# Patient Record
Sex: Female | Born: 1970 | Race: White | Hispanic: No | Marital: Married | Smoking: Former smoker
Health system: Southern US, Community
[De-identification: ages and names within clinical notes are randomized; demographics above are authoritative.]

## PROBLEM LIST (undated history)

## (undated) DIAGNOSIS — Z9889 Other specified postprocedural states: Secondary | ICD-10-CM

## (undated) DIAGNOSIS — N951 Menopausal and female climacteric states: Secondary | ICD-10-CM

## (undated) DIAGNOSIS — G4700 Insomnia, unspecified: Secondary | ICD-10-CM

## (undated) DIAGNOSIS — J45909 Unspecified asthma, uncomplicated: Secondary | ICD-10-CM

## (undated) DIAGNOSIS — R112 Nausea with vomiting, unspecified: Secondary | ICD-10-CM

## (undated) DIAGNOSIS — T8859XA Other complications of anesthesia, initial encounter: Secondary | ICD-10-CM

## (undated) HISTORY — DX: Insomnia, unspecified: G47.00

## (undated) HISTORY — PX: BREAST LUMPECTOMY: SHX2

## (undated) HISTORY — DX: Menopausal and female climacteric states: N95.1

## (undated) HISTORY — PX: TUBAL LIGATION: SHX77

---

## 2006-07-10 ENCOUNTER — Ambulatory Visit: Payer: Self-pay

## 2009-11-23 ENCOUNTER — Encounter: Payer: Self-pay | Admitting: Obstetrics & Gynecology

## 2010-04-11 ENCOUNTER — Observation Stay: Payer: Self-pay | Admitting: Obstetrics and Gynecology

## 2010-04-13 ENCOUNTER — Observation Stay: Payer: Self-pay

## 2010-04-25 ENCOUNTER — Other Ambulatory Visit: Payer: Self-pay

## 2010-05-03 ENCOUNTER — Inpatient Hospital Stay: Payer: Self-pay | Admitting: Obstetrics and Gynecology

## 2010-05-07 LAB — PATHOLOGY REPORT

## 2011-01-17 ENCOUNTER — Ambulatory Visit: Payer: Self-pay | Admitting: Obstetrics and Gynecology

## 2012-02-20 ENCOUNTER — Ambulatory Visit: Payer: Self-pay | Admitting: Obstetrics and Gynecology

## 2013-02-08 ENCOUNTER — Emergency Department: Payer: Self-pay | Admitting: Emergency Medicine

## 2013-02-08 LAB — COMPREHENSIVE METABOLIC PANEL
Alkaline Phosphatase: 96 U/L
Anion Gap: 6 — ABNORMAL LOW (ref 7–16)
BUN: 12 mg/dL (ref 7–18)
Bilirubin,Total: 0.3 mg/dL (ref 0.2–1.0)
Creatinine: 0.66 mg/dL (ref 0.60–1.30)
EGFR (African American): >60
EGFR (Non-African Amer.): >60
Glucose: 90 mg/dL (ref 65–99)
Potassium: 3.7 mmol/L (ref 3.5–5.1)
Sodium: 137 mmol/L (ref 136–145)
Total Protein: 8.2 g/dL (ref 6.4–8.2)

## 2013-02-08 LAB — CK TOTAL AND CKMB (NOT AT ARMC): CK-MB: 0.6 ng/mL (ref 0.5–3.6)

## 2013-02-08 LAB — CBC
HCT: 44.5 % (ref 35.0–47.0)
MCH: 29.3 pg (ref 26.0–34.0)
MCHC: 32.9 g/dL (ref 32.0–36.0)
MCV: 89 fL (ref 80–100)
RBC: 4.99 10*6/uL (ref 3.80–5.20)

## 2013-02-08 LAB — TROPONIN I: Troponin-I: <0.02 ng/mL

## 2013-02-22 ENCOUNTER — Ambulatory Visit: Payer: Self-pay | Admitting: Obstetrics and Gynecology

## 2014-03-03 ENCOUNTER — Ambulatory Visit: Payer: Self-pay | Admitting: Obstetrics and Gynecology

## 2014-10-15 ENCOUNTER — Ambulatory Visit
Admission: EM | Admit: 2014-10-15 | Discharge: 2014-10-15 | Disposition: A | Discharge status: Home / Self Care | Payer: BLUE CROSS/BLUE SHIELD | Attending: Family Medicine | Admitting: Family Medicine

## 2014-10-15 ENCOUNTER — Encounter: Payer: Self-pay | Admitting: Gynecology

## 2014-10-15 DIAGNOSIS — B029 Zoster without complications: Secondary | ICD-10-CM

## 2014-10-15 HISTORY — DX: Unspecified asthma, uncomplicated: J45.909

## 2014-10-15 MED ORDER — VALACYCLOVIR HCL 1 G PO TABS: 1000.0000 mg | ORAL_TABLET | Freq: Three times a day (TID) | ORAL | Status: DC

## 2014-10-15 NOTE — ED Notes (Signed)
Patient c/o rash / blister/ on left thigh x days.

## 2014-10-15 NOTE — ED Provider Notes (Signed)
CSN: [id number]     Arrival date & time 10/15/14  1458 History   First MD Initiated Contact with Patient 10/15/14 1535     Chief Complaint  Patient presents with  . Rash   (Consider location/radiation/quality/duration/timing/severity/associated sxs/prior Treatment) HPI Comments: 44 yo female with a 45 days h/o a painful, burning rash to left lower frontal thigh area. Denies any plant or chemical exposure, fevers, chills. Has tried otc topical creams including cortizone without relief.  Otherwise healthy.   Patient is a 44 y.o. female presenting with rash. The history is provided by the patient.  Rash   Past Medical History  Diagnosis Date  . Asthma    Past Surgical History  Procedure Laterality Date  . Cesarean section      2   No family history on file. Social History  Substance Use Topics  . Smoking status: Current Every Day Smoker -- 1.00 packs/day    Types: Cigarettes  . Smokeless tobacco: None  . Alcohol Use: No   OB History    No data available     Review of Systems  Skin: Positive for rash.    Allergies  Review of patient's allergies indicates no known allergies.  Home Medications   Prior to Admission medications   Medication Sig Start Date End Date Taking? Authorizing Provider  valACYclovir (VALTREX) 1000 MG tablet Take 1 tablet (1,000 mg total) by mouth 3 (three) times daily. 10/15/14   Payton Mccallum, MD   Meds Ordered and Administered this Visit  Medications - No data to display  BP 120/73 mmHg  Pulse 85  Temp(Src) 97.7 F (36.5 C) (Tympanic)  Resp 18  Ht  (1.676 m)  Wt 170 lb (77.111 kg)  BMI 27.45 kg/m2  SpO2 98%  LMP 10/01/2014 No data found.   Physical Exam  Constitutional: She appears well-developed and well-nourished. No distress.  Skin: Rash noted. Rash is vesicular. She is not diaphoretic.     Vesicular erythematous rash on left lower thigh area  Nursing note and vitals reviewed.   ED Course  Procedures (including critical  care time)  Labs Review Labs Reviewed - No data to display  Imaging Review No results found.   Visual Acuity Review  Right Eye Distance:   Left Eye Distance:   Bilateral Distance:    Right Eye Near:   Left Eye Near:    Bilateral Near:         MDM   1. Shingles    New Prescriptions   VALACYCLOVIR (VALTREX) 1000 MG TABLET    Take 1 tablet (1,000 mg total) by mouth 3 (three) times daily.  Plan: 1.  diagnosis reviewed with patient 2. rx as per orders; risks, benefits, potential side effects reviewed with patient 3. Recommend supportive treatment with otc analgesics prn 4. F/u prn if symptoms worsen or don't improve    Payton Mccallum, MD 10/15/14 1601

## 2014-10-15 NOTE — Discharge Instructions (Signed)

## 2015-01-19 ENCOUNTER — Other Ambulatory Visit: Payer: Self-pay | Admitting: Obstetrics and Gynecology

## 2015-01-19 DIAGNOSIS — Z1231 Encounter for screening mammogram for malignant neoplasm of breast: Secondary | ICD-10-CM

## 2015-03-06 ENCOUNTER — Ambulatory Visit: Payer: BLUE CROSS/BLUE SHIELD

## 2015-03-16 ENCOUNTER — Ambulatory Visit
Admission: RE | Admit: 2015-03-16 | Discharge: 2015-03-16 | Disposition: A | Discharge status: Home / Self Care | Payer: BLUE CROSS/BLUE SHIELD | Source: Ambulatory Visit | Attending: Obstetrics and Gynecology | Admitting: Obstetrics and Gynecology

## 2015-03-16 DIAGNOSIS — Z1231 Encounter for screening mammogram for malignant neoplasm of breast: Secondary | ICD-10-CM

## 2016-01-24 ENCOUNTER — Other Ambulatory Visit: Payer: Self-pay | Admitting: Obstetrics and Gynecology

## 2016-01-24 DIAGNOSIS — Z1231 Encounter for screening mammogram for malignant neoplasm of breast: Secondary | ICD-10-CM

## 2016-03-18 ENCOUNTER — Ambulatory Visit
Admission: RE | Admit: 2016-03-18 | Discharge: 2016-03-18 | Disposition: A | Discharge status: Home / Self Care | Payer: BLUE CROSS/BLUE SHIELD | Source: Ambulatory Visit | Attending: Obstetrics and Gynecology | Admitting: Obstetrics and Gynecology

## 2016-03-18 DIAGNOSIS — Z1231 Encounter for screening mammogram for malignant neoplasm of breast: Secondary | ICD-10-CM | POA: Insufficient documentation

## 2017-02-12 ENCOUNTER — Other Ambulatory Visit: Payer: Self-pay | Admitting: Obstetrics and Gynecology

## 2017-02-12 DIAGNOSIS — Z1231 Encounter for screening mammogram for malignant neoplasm of breast: Secondary | ICD-10-CM

## 2017-03-19 ENCOUNTER — Ambulatory Visit
Admission: RE | Admit: 2017-03-19 | Discharge: 2017-03-19 | Disposition: A | Discharge status: Home / Self Care | Payer: BLUE CROSS/BLUE SHIELD | Source: Ambulatory Visit | Attending: Obstetrics and Gynecology | Admitting: Obstetrics and Gynecology

## 2017-03-19 DIAGNOSIS — Z1231 Encounter for screening mammogram for malignant neoplasm of breast: Secondary | ICD-10-CM

## 2017-11-26 DIAGNOSIS — F4323 Adjustment disorder with mixed anxiety and depressed mood: Secondary | ICD-10-CM | POA: Diagnosis not present

## 2018-02-25 DIAGNOSIS — F4323 Adjustment disorder with mixed anxiety and depressed mood: Secondary | ICD-10-CM | POA: Diagnosis not present

## 2018-03-03 DIAGNOSIS — Z124 Encounter for screening for malignant neoplasm of cervix: Secondary | ICD-10-CM | POA: Diagnosis not present

## 2018-03-03 DIAGNOSIS — Z01419 Encounter for gynecological examination (general) (routine) without abnormal findings: Secondary | ICD-10-CM | POA: Diagnosis not present

## 2018-03-17 ENCOUNTER — Other Ambulatory Visit: Payer: Self-pay | Admitting: Obstetrics and Gynecology

## 2018-03-17 DIAGNOSIS — Z1231 Encounter for screening mammogram for malignant neoplasm of breast: Secondary | ICD-10-CM

## 2018-05-06 ENCOUNTER — Other Ambulatory Visit: Payer: Self-pay

## 2018-05-06 ENCOUNTER — Ambulatory Visit
Admission: RE | Admit: 2018-05-06 | Discharge: 2018-05-06 | Disposition: A | Discharge status: Home / Self Care | Payer: BLUE CROSS/BLUE SHIELD | Source: Ambulatory Visit | Attending: Obstetrics and Gynecology | Admitting: Obstetrics and Gynecology

## 2018-05-06 DIAGNOSIS — Z1231 Encounter for screening mammogram for malignant neoplasm of breast: Secondary | ICD-10-CM | POA: Diagnosis not present

## 2019-03-17 DIAGNOSIS — Z01419 Encounter for gynecological examination (general) (routine) without abnormal findings: Secondary | ICD-10-CM | POA: Diagnosis not present

## 2019-03-17 DIAGNOSIS — Z87891 Personal history of nicotine dependence: Secondary | ICD-10-CM | POA: Diagnosis not present

## 2019-03-17 DIAGNOSIS — Z124 Encounter for screening for malignant neoplasm of cervix: Secondary | ICD-10-CM | POA: Diagnosis not present

## 2019-03-17 DIAGNOSIS — Z716 Tobacco abuse counseling: Secondary | ICD-10-CM | POA: Diagnosis not present

## 2019-03-17 LAB — HM PAP SMEAR

## 2019-03-18 ENCOUNTER — Other Ambulatory Visit: Payer: Self-pay | Admitting: Certified Nurse Midwife

## 2019-03-18 DIAGNOSIS — Z1231 Encounter for screening mammogram for malignant neoplasm of breast: Secondary | ICD-10-CM

## 2019-04-02 ENCOUNTER — Ambulatory Visit: Payer: Self-pay

## 2019-04-02 NOTE — Telephone Encounter (Signed)
Ok to move new pt appt forward if she would like

## 2019-04-02 NOTE — Telephone Encounter (Signed)
Pt. Reports she has a new pt. Appointment for 04/13/19. Has had lower abdominal pain that comes and goes for 20 years. Pain is 5/10 when she has it. At times radiates into her back and down her legs. Has chronic diarrhea. No fever. Does have occasional nausea. Instructed to go to ED if symptoms worsen. Notify pt. If there is a sooner appointment.  Answer Assessment - Initial Assessment Questions 1. LOCATION: "Where does it hurt?"      Lower abd. And all across 2. RADIATION: "Does the pain shoot anywhere else?" (e.g., chest, back)     Back and legs 3. ONSET: "When did the pain begin?" (e.g., minutes, hours or days ago)      Started 20 years ago 4. SUDDEN: "Gradual or sudden onset?"     Gradual 5. PATTERN "Does the pain come and go, or is it constant?"    - If constant: "Is it getting better, staying the same, or worsening?"      (Note: Constant means the pain never goes away completely; most serious pain is constant and it progresses)     - If intermittent: "How long does it last?" "Do you have pain now?"     (Note: Intermittent means the pain goes away completely between bouts)     Comes and goes 6. SEVERITY: "How bad is the pain?"  (e.g., Scale 1-10; mild, moderate, or severe)   - MILD (1-3): doesn't interfere with normal activities, abdomen soft and not tender to touch    - MODERATE (4-7): interferes with normal activities or awakens from sleep, tender to touch    - SEVERE (8-10): excruciating pain, doubled over, unable to do any normal activities      5 7. RECURRENT SYMPTOM: "Have you ever had this type of abdominal pain before?" If so, ask: "When was the last time?" and "What happened that time?"      Yes 8. CAUSE: "What do you think is causing the abdominal pain?"     Unsure 9. RELIEVING/AGGRAVATING FACTORS: "What makes it better or worse?" (e.g., movement, antacids, bowel movement)     No 10. OTHER SYMPTOMS: "Has there been any vomiting, diarrhea, constipation, or urine problems?"      Nausea 11. PREGNANCY: "Is there any chance you are pregnant?" "When was your last menstrual period?"       No  Protocols used: ABDOMINAL PAIN - [facility]

## 2019-04-02 NOTE — Telephone Encounter (Signed)
Moved appt to 04/07/19

## 2019-04-07 ENCOUNTER — Encounter: Payer: Self-pay | Admitting: Family Medicine

## 2019-04-07 ENCOUNTER — Other Ambulatory Visit: Payer: Self-pay

## 2019-04-07 ENCOUNTER — Ambulatory Visit: Payer: BC Managed Care – PPO | Admitting: Family Medicine

## 2019-04-07 VITALS — BP 119/77 | HR 65 | Temp 98.5°F | Ht 65.2 in | Wt 190.0 lb

## 2019-04-07 DIAGNOSIS — F329 Major depressive disorder, single episode, unspecified: Secondary | ICD-10-CM | POA: Diagnosis not present

## 2019-04-07 DIAGNOSIS — Z23 Encounter for immunization: Secondary | ICD-10-CM

## 2019-04-07 DIAGNOSIS — Z7689 Persons encountering health services in other specified circumstances: Secondary | ICD-10-CM

## 2019-04-07 DIAGNOSIS — R1030 Lower abdominal pain, unspecified: Secondary | ICD-10-CM

## 2019-04-07 DIAGNOSIS — Z114 Encounter for screening for human immunodeficiency virus [HIV]: Secondary | ICD-10-CM | POA: Diagnosis not present

## 2019-04-07 DIAGNOSIS — Z0000 Encounter for general adult medical examination without abnormal findings: Secondary | ICD-10-CM

## 2019-04-07 DIAGNOSIS — G4700 Insomnia, unspecified: Secondary | ICD-10-CM

## 2019-04-07 DIAGNOSIS — R8271 Bacteriuria: Secondary | ICD-10-CM | POA: Diagnosis not present

## 2019-04-07 DIAGNOSIS — F32A Depression, unspecified: Secondary | ICD-10-CM | POA: Insufficient documentation

## 2019-04-07 MED ORDER — DICYCLOMINE HCL 10 MG PO CAPS: 10.0000 mg | ORAL_CAPSULE | Freq: Three times a day (TID) | ORAL | 0 refills | Status: DC

## 2019-04-07 MED ORDER — ESCITALOPRAM OXALATE 5 MG PO TABS: 5.0000 mg | ORAL_TABLET | Freq: Every day | ORAL | 1 refills | Status: DC

## 2019-04-07 MED ORDER — TRAZODONE HCL 100 MG PO TABS: 100.0000 mg | ORAL_TABLET | Freq: Every evening | ORAL | 1 refills | Status: DC | PRN

## 2019-04-07 NOTE — Progress Notes (Deleted)
   Ht 5' 5.2" (1.656 m)   Wt 190 lb (86.2 kg)   BMI 31.42 kg/m    Subjective:    Patient ID: Megan Mendoza, female    DOB: Oct 25, 1970, 49 y.o.   MRN: [id number]  HPI: Megan Mendoza is a 49 y.o. female  Chief Complaint  Patient presents with  . Establish Care  . Abdominal Pain    low abdomen x a week   Patient presenting today to establish care.   Lower abdominal pain consistently the past week. Has had this to a lesser extent off and on for about 20 years but never this consistent or severe. Pain is in lower abdomen, switches sides at times, sometimes radiating upward. Usually a dull ache but sometimes sharp. Gassy, bloated. Sometimes constipated. Sometimes nauseated. Laxative did help some. Gas x didn't help. Fhx of gallbladder dz.   Depression - Lexapro too strong at 20 mg dose, caused shakes so takes 1/4 tab which seems to do well for her. Denies mood swings, SI/HI, side effects.   Insomnia - Taking trazodone which seems to be working very well.   Relevant past medical, surgical, family and social history reviewed and updated as indicated. Interim medical history since our last visit reviewed. Allergies and medications reviewed and updated.  Review of Systems  Per HPI unless specifically indicated above     Objective:    Ht 5' 5.2" (1.656 m)   Wt 190 lb (86.2 kg)   BMI 31.42 kg/m   Wt Readings from Last 3 Encounters:  04/07/19 190 lb (86.2 kg)  10/15/14 170 lb (77.1 kg)    Physical Exam  No results found for this or any previous visit.    Assessment & Plan:   Problem List Items Addressed This Visit    None       Follow up plan: No follow-ups on file.

## 2019-04-08 LAB — COMPREHENSIVE METABOLIC PANEL
ALT: 27 IU/L (ref 0–32)
AST: 20 IU/L (ref 0–40)
Albumin/Globulin Ratio: 1.8 (ref 1.2–2.2)
Albumin: 4.6 g/dL (ref 3.8–4.8)
Alkaline Phosphatase: 93 IU/L (ref 39–117)
BUN/Creatinine Ratio: 18 (ref 9–23)
BUN: 17 mg/dL (ref 6–24)
Bilirubin Total: 0.3 mg/dL (ref 0.0–1.2)
CO2: 23 mmol/L (ref 20–29)
Calcium: 10 mg/dL (ref 8.7–10.2)
Chloride: 101 mmol/L (ref 96–106)
Creatinine, Ser: 0.95 mg/dL (ref 0.57–1.00)
GFR calc Af Amer: 81 mL/min/{1.73_m2} (ref >59)
GFR calc non Af Amer: 71 mL/min/{1.73_m2} (ref >59)
Globulin, Total: 2.6 g/dL (ref 1.5–4.5)
Glucose: 80 mg/dL (ref 65–99)
Potassium: 4.5 mmol/L (ref 3.5–5.2)
Sodium: 140 mmol/L (ref 134–144)
Total Protein: 7.2 g/dL (ref 6.0–8.5)

## 2019-04-08 LAB — LIPASE: Lipase: 33 U/L (ref 14–72)

## 2019-04-08 LAB — CBC WITH DIFFERENTIAL/PLATELET
Basophils Absolute: 0.1 10*3/uL (ref 0.0–0.2)
Basos: 1 %
EOS (ABSOLUTE): 0.3 10*3/uL (ref 0.0–0.4)
Eos: 5 %
Hematocrit: 46.7 % — ABNORMAL HIGH (ref 34.0–46.6)
Hemoglobin: 15.3 g/dL (ref 11.1–15.9)
Immature Grans (Abs): 0 10*3/uL (ref 0.0–0.1)
Immature Granulocytes: 0 %
Lymphocytes Absolute: 2.2 10*3/uL (ref 0.7–3.1)
Lymphs: 30 %
MCH: 29.9 pg (ref 26.6–33.0)
MCHC: 32.8 g/dL (ref 31.5–35.7)
MCV: 91 fL (ref 79–97)
Monocytes Absolute: 0.5 10*3/uL (ref 0.1–0.9)
Monocytes: 7 %
Neutrophils Absolute: 4.3 10*3/uL (ref 1.4–7.0)
Neutrophils: 57 %
Platelets: 217 10*3/uL (ref 150–450)
RBC: 5.11 x10E6/uL (ref 3.77–5.28)
RDW: 12.6 % (ref 11.7–15.4)
WBC: 7.4 10*3/uL (ref 3.4–10.8)

## 2019-04-08 LAB — LIPID PANEL W/O CHOL/HDL RATIO
Cholesterol, Total: 181 mg/dL (ref 100–199)
HDL: 45 mg/dL (ref >39)
LDL Chol Calc (NIH): 113 mg/dL — ABNORMAL HIGH (ref 0–99)
Triglycerides: 128 mg/dL (ref 0–149)
VLDL Cholesterol Cal: 23 mg/dL (ref 5–40)

## 2019-04-08 LAB — HIV ANTIBODY (ROUTINE TESTING W REFLEX): HIV Screen 4th Generation wRfx: NONREACTIVE

## 2019-04-08 LAB — TSH: TSH: 2.84 u[IU]/mL (ref 0.450–4.500)

## 2019-04-09 LAB — UA/M W/RFLX CULTURE, ROUTINE
Bilirubin, UA: NEGATIVE
Glucose, UA: NEGATIVE
Ketones, UA: NEGATIVE
Leukocytes,UA: NEGATIVE
Nitrite, UA: NEGATIVE
Protein,UA: NEGATIVE
Specific Gravity, UA: 1.02 (ref 1.005–1.030)
Urobilinogen, Ur: 0.2 mg/dL (ref 0.2–1.0)
pH, UA: 5 (ref 5.0–7.5)

## 2019-04-09 LAB — MICROSCOPIC EXAMINATION

## 2019-04-09 LAB — URINE CULTURE, REFLEX

## 2019-04-13 ENCOUNTER — Ambulatory Visit: Payer: Self-pay | Admitting: Family Medicine

## 2019-04-18 DIAGNOSIS — G4700 Insomnia, unspecified: Secondary | ICD-10-CM | POA: Insufficient documentation

## 2019-04-18 NOTE — Progress Notes (Addendum)
BP 119/77   Pulse 65   Temp 98.5 F (36.9 C) (Oral)   Ht 5' 5.2" (1.656 m)   Wt 190 lb (86.2 kg)   SpO2 96%   BMI 31.42 kg/m    Subjective:    Patient ID: Megan Mendoza, female    DOB: 09-Feb-1971, 49 y.o.   MRN: [id number]  HPI: Megan Mendoza is a 49 y.o. female presenting on 04/07/2019 for comprehensive medical examination. Current medical complaints include:see below  Patient presenting today to establish care.   Lower abdominal pain consistently the past week. Has had this to a lesser extent off and on for about 20 years but never this consistent or severe. Pain is in lower abdomen, switches sides at times, sometimes radiating upward. Usually a dull ache but sometimes sharp. Gassy, bloated. Sometimes constipated. Sometimes nauseated. Laxative did help some. Gas x didn't help. Fhx of gallbladder dz.   Depression - Lexapro too strong at 20 mg dose, caused shakes so takes 1/4 tab which seems to do well for her. Denies mood swings, SI/HI, side effects.   Insomnia - Taking trazodone which seems to be working very well.   She currently lives with: Menopausal Symptoms: no  Depression Screen done today and results listed below:  Depression screen [facility] 2/9 04/07/2019  Decreased Interest 1  Down, Depressed, Hopeless 0  PHQ - 2 Score 1  Altered sleeping 2  Tired, decreased energy 2  Change in appetite 0  Feeling bad or failure about yourself  0  Trouble concentrating 0  Moving slowly or fidgety/restless 0  Suicidal thoughts 0  PHQ-9 Score 5    The patient does not have a history of falls. I did complete a risk assessment for falls. A plan of care for falls was documented.   Past Medical History:  Past Medical History:  Diagnosis Date  . Insomnia   . Perimenopausal     Surgical History:  Past Surgical History:  Procedure Laterality Date  . CESAREAN SECTION     2  . TUBAL LIGATION      Medications:  No current outpatient medications on file prior to visit.     No current facility-administered medications on file prior to visit.    Allergies:  Allergies  Allergen Reactions  . Aspirin Nausea And Vomiting    Social History:  Social History   Socioeconomic History  . Marital status: Married    Spouse name: Not on file  . Number of children: Not on file  . Years of education: Not on file  . Highest education level: Not on file  Occupational History  . Not on file  Tobacco Use  . Smoking status: Former Smoker    Packs/day: 1.00    Types: Cigarettes  . Smokeless tobacco: Never Used  . Tobacco comment: pt states she stopped smoking 4 days ago  Vaping Use  . Vaping Use: Never used  Substance and Sexual Activity  . Alcohol use: Yes    Comment: ocassionally  . Drug use: Never  . Sexual activity: Yes  Other Topics Concern  . Not on file  Social History Narrative  . Not on file   Social Determinants of Health   Financial Resource Strain:   . Difficulty of Paying Living Expenses:   Food Insecurity:   . Worried About Programme researcher, broadcasting/film/video in the Last Year:   . [employer] of Food in the Last Year:   Transportation Needs:   . Lack of  Transportation (Medical):   Marland Kitchen Lack of Transportation (Non-Medical):   Physical Activity:   . Days of Exercise per Week:   . Minutes of Exercise per Session:   Stress:   . Feeling of Stress :   Social Connections:   . Frequency of Communication with Friends and Family:   . Frequency of Social Gatherings with Friends and Family:   . Attends Religious Services:   . Active Member of Clubs or Organizations:   . Attends Banker Meetings:   Marland Kitchen Marital Status:   Intimate Partner Violence:   . Fear of Current or Ex-Partner:   . Emotionally Abused:   Marland Kitchen Physically Abused:   . Sexually Abused:    Social History   Tobacco Use  Smoking Status Former Smoker  . Packs/day: 1.00  . Types: Cigarettes  Smokeless Tobacco Never Used  Tobacco Comment   pt states she stopped smoking 4 days ago    Social History   Substance and Sexual Activity  Alcohol Use Yes   Comment: ocassionally    Family History:  Family History  Problem Relation Age of Onset  . Heart disease Mother   . Lung cancer Father   . Asthma Son   . ADD / ADHD Son   . Breast cancer Neg Hx     Past medical history, surgical history, medications, allergies, family history and social history reviewed with patient today and changes made to appropriate areas of the chart.   Review of Systems - General ROS: negative Psychological ROS: negative Ophthalmic ROS: negative ENT ROS: negative Allergy and Immunology ROS: negative Hematological and Lymphatic ROS: negative Endocrine ROS: negative Breast ROS: negative for breast lumps Respiratory ROS: no cough, shortness of breath, or wheezing Cardiovascular ROS: no chest pain or dyspnea on exertion Gastrointestinal ROS: no abdominal pain, change in bowel habits, or black or bloody stools Genito-Urinary ROS: no dysuria, trouble voiding, or hematuria Musculoskeletal ROS: negative Neurological ROS: no TIA or stroke symptoms Dermatological ROS: negative All other ROS negative except what is listed above and in the HPI.      Objective:    BP 119/77   Pulse 65   Temp 98.5 F (36.9 C) (Oral)   Ht 5' 5.2" (1.656 m)   Wt 190 lb (86.2 kg)   SpO2 96%   BMI 31.42 kg/m   Wt Readings from Last 3 Encounters:  04/07/19 190 lb (86.2 kg)  10/15/14 170 lb (77.1 kg)    Physical Exam Vitals and nursing note reviewed.  Constitutional:      General: She is not in acute distress.    Appearance: She is well-developed.  HENT:     Head: Atraumatic.     Right Ear: External ear normal.     Left Ear: External ear normal.     Nose: Nose normal.     Mouth/Throat:     Pharynx: No oropharyngeal exudate.  Eyes:     General: No scleral icterus.    Conjunctiva/sclera: Conjunctivae normal.     Pupils: Pupils are equal, round, and reactive to light.  Neck:     Thyroid: No  thyromegaly.  Cardiovascular:     Rate and Rhythm: Normal rate and regular rhythm.     Heart sounds: Normal heart sounds.  Pulmonary:     Effort: Pulmonary effort is normal. No respiratory distress.     Breath sounds: Normal breath sounds.  Chest:     Comments: Breast exam declined Abdominal:     General: Bowel  sounds are normal.     Palpations: Abdomen is soft. There is no mass.     Tenderness: There is no abdominal tenderness.  Genitourinary:    Comments: GU exam declined Musculoskeletal:        General: No tenderness. Normal range of motion.     Cervical back: Normal range of motion and neck supple.  Lymphadenopathy:     Cervical: No cervical adenopathy.  Skin:    General: Skin is warm and dry.     Findings: No rash.  Neurological:     Mental Status: She is alert and oriented to person, place, and time.     Cranial Nerves: No cranial nerve deficit.  Psychiatric:        Behavior: Behavior normal.     Results for orders placed or performed in visit on 04/07/19  Microscopic Examination   URINE  Result Value Ref Range   WBC, UA 0-5 0 - 5 /hpf   RBC 0-2 0 - 2 /hpf   Epithelial Cells (non renal) 0-10 0 - 10 /hpf   Bacteria, UA Moderate (A) None seen/Few  Urine Culture, Reflex   URINE  Result Value Ref Range   Urine Culture, Routine Final report    Organism ID, Bacteria Comment   CBC with Differential/Platelet  Result Value Ref Range   WBC 7.4 3.4 - 10.8 x10E3/uL   RBC 5.11 3.77 - 5.28 x10E6/uL   Hemoglobin 15.3 11.1 - 15.9 g/dL   Hematocrit 10.9 (H) 32.3 - 46.6 %   MCV 91 79 - 97 fL   MCH 29.9 26.6 - 33.0 pg   MCHC 32.8 31 - 35 g/dL   RDW 55.7 32.2 - 02.5 %   Platelets 217 150 - 450 x10E3/uL   Neutrophils 57 Not Estab. %   Lymphs 30 Not Estab. %   Monocytes 7 Not Estab. %   Eos 5 Not Estab. %   Basos 1 Not Estab. %   Neutrophils Absolute 4.3 1 - 7 x10E3/uL   Lymphocytes Absolute 2.2 0 - 3 x10E3/uL   Monocytes Absolute 0.5 0 - 0 x10E3/uL   EOS (ABSOLUTE) 0.3  0.0 - 0.4 x10E3/uL   Basophils Absolute 0.1 0 - 0 x10E3/uL   Immature Granulocytes 0 Not Estab. %   Immature Grans (Abs) 0.0 0.0 - 0.1 x10E3/uL  Comprehensive metabolic panel  Result Value Ref Range   Glucose 80 65 - 99 mg/dL   BUN 17 6 - 24 mg/dL   Creatinine, Ser 4.27 0.57 - 1.00 mg/dL   GFR calc non Af Amer 71 >59 mL/min/1.73   GFR calc Af Amer 81 >59 mL/min/1.73   BUN/Creatinine Ratio 18 9 - 23   Sodium 140 134 - 144 mmol/L   Potassium 4.5 3.5 - 5.2 mmol/L   Chloride 101 96 - 106 mmol/L   CO2 23 20 - 29 mmol/L   Calcium 10.0 8.7 - 10.2 mg/dL   Total Protein 7.2 6.0 - 8.5 g/dL   Albumin 4.6 3.8 - 4.8 g/dL   Globulin, Total 2.6 1.5 - 4.5 g/dL   Albumin/Globulin Ratio 1.8 1.2 - 2.2   Bilirubin Total 0.3 0.0 - 1.2 mg/dL   Alkaline Phosphatase 93 39 - 117 IU/L   AST 20 0 - 40 IU/L   ALT 27 0 - 32 IU/L  Lipid Panel w/o Chol/HDL Ratio  Result Value Ref Range   Cholesterol, Total 181 100 - 199 mg/dL   Triglycerides 062 0 - 149 mg/dL   HDL 45 >  39 mg/dL   VLDL Cholesterol Cal 23 5 - 40 mg/dL   LDL Chol Calc (NIH) 034 (H) 0 - 99 mg/dL  TSH  Result Value Ref Range   TSH 2.840 0.450 - 4.500 uIU/mL  UA/M w/rflx Culture, Routine   Specimen: Urine   URINE  Result Value Ref Range   Specific Gravity, UA 1.020 1.005 - 1.030   pH, UA 5.0 5.0 - 7.5   Color, UA Yellow Yellow   Appearance Ur Clear Clear   Leukocytes,UA Negative Negative   Protein,UA Negative Negative/Trace   Glucose, UA Negative Negative   Ketones, UA Negative Negative   RBC, UA 2+ (A) Negative   Bilirubin, UA Negative Negative   Urobilinogen, Ur 0.2 0.2 - 1.0 mg/dL   Nitrite, UA Negative Negative   Microscopic Examination See below:    Urinalysis Reflex Comment   Lipase  Result Value Ref Range   Lipase 33 14 - 72 U/L  HIV Antibody (routine testing w rflx)  Result Value Ref Range   HIV Screen 4th Generation wRfx Non Reactive Non Reactive      Assessment & Plan:   Problem List Items Addressed This Visit       Other   Depression - Primary    Stable and well controlled, continue current regimen      Relevant Medications   escitalopram (LEXAPRO) 5 MG tablet   traZODone (DESYREL) 100 MG tablet   Insomnia    Stable and under good control, continue current regimen       Other Visit Diagnoses    Encounter to establish care       Annual physical exam       Relevant Orders   CBC with Differential/Platelet (Completed)   Comprehensive metabolic panel (Completed)   Lipid Panel w/o Chol/HDL Ratio (Completed)   TSH (Completed)   UA/M w/rflx Culture, Routine (Completed)   Lipase (Completed)   Lower abdominal pain       Suspect IBS, but obtain labs and trial bentyl, diet changes. Will obtain imaging if needed   Relevant Orders   Microscopic Examination (Completed)   Urine Culture, Reflex (Completed)   Encounter for screening for HIV       Relevant Orders   HIV Antibody (routine testing w rflx) (Completed)   Need for diphtheria-tetanus-pertussis (Tdap) vaccine           Follow up plan: Return in about 4 weeks (around 05/05/2019) for abdominal pain f/u.   LABORATORY TESTING:  - Pap smear: postponed  IMMUNIZATIONS:   - Tdap: Tetanus vaccination status reviewed: Td vaccination indicated and given today. - Influenza: Up to date  SCREENING: -Mammogram: scheduled    PATIENT COUNSELING:   Advised to take 1 mg of folate supplement per day if capable of pregnancy.   Sexuality: Discussed sexually transmitted diseases, partner selection, use of condoms, avoidance of unintended pregnancy  and contraceptive alternatives.   Advised to avoid cigarette smoking.  I discussed with the patient that most people either abstain from alcohol or drink within safe limits (<=14/week and <=4 drinks/occasion for males, <=7/weeks and <= 3 drinks/occasion for females) and that the risk for alcohol disorders and other health effects rises proportionally with the number of drinks per week and how often a  drinker exceeds daily limits.  Discussed cessation/primary prevention of drug use and availability of treatment for abuse.   Diet: Encouraged to adjust caloric intake to maintain  or achieve ideal body weight, to reduce intake of dietary saturated  fat and total fat, to limit sodium intake by avoiding high sodium foods and not adding table salt, and to maintain adequate dietary potassium and calcium preferably from fresh fruits, vegetables, and low-fat dairy products.    stressed the importance of regular exercise  Injury prevention: Discussed safety belts, safety helmets, smoke detector, smoking near bedding or upholstery.   Dental health: Discussed importance of regular tooth brushing, flossing, and dental visits.    NEXT PREVENTATIVE PHYSICAL DUE IN 1 YEAR. Return in about 4 weeks (around 05/05/2019) for abdominal pain f/u.

## 2019-04-18 NOTE — Assessment & Plan Note (Signed)
Stable and well controlled, continue current regimen 

## 2019-04-18 NOTE — Assessment & Plan Note (Signed)
Stable and under good control, continue current regimen 

## 2019-05-10 ENCOUNTER — Other Ambulatory Visit: Payer: Self-pay

## 2019-05-10 ENCOUNTER — Ambulatory Visit
Admission: RE | Admit: 2019-05-10 | Discharge: 2019-05-10 | Disposition: A | Discharge status: Home / Self Care | Payer: BC Managed Care – PPO | Source: Ambulatory Visit | Attending: Certified Nurse Midwife | Admitting: Certified Nurse Midwife

## 2019-05-10 DIAGNOSIS — Z1231 Encounter for screening mammogram for malignant neoplasm of breast: Secondary | ICD-10-CM

## 2019-06-07 ENCOUNTER — Ambulatory Visit: Payer: BC Managed Care – PPO | Attending: Internal Medicine

## 2019-06-07 DIAGNOSIS — Z23 Encounter for immunization: Secondary | ICD-10-CM

## 2019-06-07 NOTE — Progress Notes (Signed)
   Covid-19 Vaccination Clinic  Name:  Megan Mendoza    MRN: [id number] DOB: [birth year]  06/07/2019  [name] was observed post Covid-19 immunization for 15 minutes without incident. She was provided with Vaccine Information Sheet and instruction to access the V-Safe system.   [name] was instructed to call 911 with any severe reactions post vaccine: Marland Kitchen Difficulty breathing  . Swelling of face and throat  . A fast heartbeat  . A bad rash all over body  . Dizziness and weakness   Immunizations Administered    Name Date Dose VIS Date Route   Pfizer COVID-19 Vaccine 06/07/2019  8:09 AM 0.3 mL 04/14/2018 Intramuscular   Manufacturer: [employer], [employer]   Lot: [id number]   NDC: [address]-0

## 2019-06-30 ENCOUNTER — Ambulatory Visit: Payer: BC Managed Care – PPO | Attending: Internal Medicine

## 2019-06-30 DIAGNOSIS — Z23 Encounter for immunization: Secondary | ICD-10-CM

## 2019-06-30 NOTE — Progress Notes (Signed)
   Covid-19 Vaccination Clinic  Name:  ZYIA KANEKO    MRN: [id number] DOB: 1970/09/13  06/30/2019  Ms. Suares was observed post Covid-19 immunization for 15 minutes without incident. She was provided with Vaccine Information Sheet and instruction to access the V-Safe system.   Ms. Bakken was instructed to call 911 with any severe reactions post vaccine: Marland Kitchen Difficulty breathing  . Swelling of face and throat  . A fast heartbeat  . A bad rash all over body  . Dizziness and weakness   Immunizations Administered    Name Date Dose VIS Date Route   Pfizer COVID-19 Vaccine 06/30/2019  4:54 PM 0.3 mL 04/14/2018 Intramuscular   Manufacturer: [employer], [employer]   Lot: [id number]   NDC: [address]-8

## 2019-08-20 ENCOUNTER — Telehealth: Payer: Self-pay | Admitting: Family Medicine

## 2019-08-20 NOTE — Telephone Encounter (Signed)
erroniouse error

## 2019-08-26 ENCOUNTER — Other Ambulatory Visit: Payer: Self-pay | Admitting: Family Medicine

## 2019-10-13 ENCOUNTER — Encounter: Payer: Self-pay | Admitting: Family Medicine

## 2019-10-13 ENCOUNTER — Other Ambulatory Visit: Payer: Self-pay

## 2019-10-13 ENCOUNTER — Ambulatory Visit: Payer: BC Managed Care – PPO | Admitting: Family Medicine

## 2019-10-13 DIAGNOSIS — F17210 Nicotine dependence, cigarettes, uncomplicated: Secondary | ICD-10-CM | POA: Diagnosis not present

## 2019-10-13 MED ORDER — CHANTIX STARTING MONTH PAK 0.5 MG X 11 & 1 MG X 42 PO TABS: ORAL_TABLET | ORAL | 0 refills | Status: DC

## 2019-10-13 NOTE — Progress Notes (Signed)
BP 117/78   Pulse 67   Temp 99.1 F (37.3 C) (Oral)   Wt 177 lb (80.3 kg)   SpO2 94%   BMI 29.27 kg/m    Subjective:    Patient ID: Megan Mendoza, female    DOB: 1971/02/05, 49 y.o.   MRN: [id number]  HPI: Megan Mendoza is a 49 y.o. female  Chief Complaint  Patient presents with  . Nicotine Dependence    pt requesting Rx chantix   Here today wanting to discuss smoking cessation. Has tried quitting with chantix in the past and was initially successful but stopped the medicine too early and fell back into habits. Overall tolerated well previously, just nauseated with it if not taking with food. Smoking less than a ppd currently and very ready to quit. Denies hx of SI/HI.   Relevant past medical, surgical, family and social history reviewed and updated as indicated. Interim medical history since our last visit reviewed. Allergies and medications reviewed and updated.  Review of Systems  Per HPI unless specifically indicated above     Objective:    BP 117/78   Pulse 67   Temp 99.1 F (37.3 C) (Oral)   Wt 177 lb (80.3 kg)   SpO2 94%   BMI 29.27 kg/m   Wt Readings from Last 3 Encounters:  10/13/19 177 lb (80.3 kg)  04/07/19 190 lb (86.2 kg)  10/15/14 170 lb (77.1 kg)    Physical Exam Vitals and nursing note reviewed.  Constitutional:      Appearance: Normal appearance. She is not ill-appearing.  HENT:     Head: Atraumatic.  Eyes:     Extraocular Movements: Extraocular movements intact.     Conjunctiva/sclera: Conjunctivae normal.  Cardiovascular:     Rate and Rhythm: Normal rate and regular rhythm.     Heart sounds: Normal heart sounds.  Pulmonary:     Effort: Pulmonary effort is normal.     Breath sounds: Normal breath sounds.  Musculoskeletal:        General: Normal range of motion.     Cervical back: Normal range of motion and neck supple.  Skin:    General: Skin is warm and dry.  Neurological:     Mental Status: She is alert and oriented to  person, place, and time.  Psychiatric:        Mood and Affect: Mood normal.        Thought Content: Thought content normal.        Judgment: Judgment normal.     Results for orders placed or performed in visit on 04/07/19  Microscopic Examination   URINE  Result Value Ref Range   WBC, UA 0-5 0 - 5 /hpf   RBC 0-2 0 - 2 /hpf   Epithelial Cells (non renal) 0-10 0 - 10 /hpf   Bacteria, UA Moderate (A) None seen/Few  Urine Culture, Reflex   URINE  Result Value Ref Range   Urine Culture, Routine Final report    Organism ID, Bacteria Comment   CBC with Differential/Platelet  Result Value Ref Range   WBC 7.4 3.4 - 10.8 x10E3/uL   RBC 5.11 3.77 - 5.28 x10E6/uL   Hemoglobin 15.3 11.1 - 15.9 g/dL   Hematocrit 16.0 (H) 73.7 - 46.6 %   MCV 91 79 - 97 fL   MCH 29.9 26.6 - 33.0 pg   MCHC 32.8 31 - 35 g/dL   RDW 10.6 26.9 - 48.5 %   Platelets 217 150 -  450 x10E3/uL   Neutrophils 57 Not Estab. %   Lymphs 30 Not Estab. %   Monocytes 7 Not Estab. %   Eos 5 Not Estab. %   Basos 1 Not Estab. %   Neutrophils Absolute 4.3 1 - 7 x10E3/uL   Lymphocytes Absolute 2.2 0 - 3 x10E3/uL   Monocytes Absolute 0.5 0 - 0 x10E3/uL   EOS (ABSOLUTE) 0.3 0.0 - 0.4 x10E3/uL   Basophils Absolute 0.1 0 - 0 x10E3/uL   Immature Granulocytes 0 Not Estab. %   Immature Grans (Abs) 0.0 0.0 - 0.1 x10E3/uL  Comprehensive metabolic panel  Result Value Ref Range   Glucose 80 65 - 99 mg/dL   BUN 17 6 - 24 mg/dL   Creatinine, Ser 5.05 0.57 - 1.00 mg/dL   GFR calc non Af Amer 71 >59 mL/min/1.73   GFR calc Af Amer 81 >59 mL/min/1.73   BUN/Creatinine Ratio 18 9 - 23   Sodium 140 134 - 144 mmol/L   Potassium 4.5 3.5 - 5.2 mmol/L   Chloride 101 96 - 106 mmol/L   CO2 23 20 - 29 mmol/L   Calcium 10.0 8.7 - 10.2 mg/dL   Total Protein 7.2 6.0 - 8.5 g/dL   Albumin 4.6 3.8 - 4.8 g/dL   Globulin, Total 2.6 1.5 - 4.5 g/dL   Albumin/Globulin Ratio 1.8 1.2 - 2.2   Bilirubin Total 0.3 0.0 - 1.2 mg/dL   Alkaline Phosphatase  93 39 - 117 IU/L   AST 20 0 - 40 IU/L   ALT 27 0 - 32 IU/L  Lipid Panel w/o Chol/HDL Ratio  Result Value Ref Range   Cholesterol, Total 181 100 - 199 mg/dL   Triglycerides 397 0 - 149 mg/dL   HDL 45 >67 mg/dL   VLDL Cholesterol Cal 23 5 - 40 mg/dL   LDL Chol Calc (NIH) 341 (H) 0 - 99 mg/dL  TSH  Result Value Ref Range   TSH 2.840 0.450 - 4.500 uIU/mL  UA/M w/rflx Culture, Routine   Specimen: Urine   URINE  Result Value Ref Range   Specific Gravity, UA 1.020 1.005 - 1.030   pH, UA 5.0 5.0 - 7.5   Color, UA Yellow Yellow   Appearance Ur Clear Clear   Leukocytes,UA Negative Negative   Protein,UA Negative Negative/Trace   Glucose, UA Negative Negative   Ketones, UA Negative Negative   RBC, UA 2+ (A) Negative   Bilirubin, UA Negative Negative   Urobilinogen, Ur 0.2 0.2 - 1.0 mg/dL   Nitrite, UA Negative Negative   Microscopic Examination See below:    Urinalysis Reflex Comment   Lipase  Result Value Ref Range   Lipase 33 14 - 72 U/L  HIV Antibody (routine testing w rflx)  Result Value Ref Range   HIV Screen 4th Generation wRfx Non Reactive Non Reactive      Assessment & Plan:   Problem List Items Addressed This Visit      Other   Cigarette smoker    Restart chantix, set quit date, counseling provided regarding habit replacement, NRT prn. F/u 1 month for recheck          Follow up plan: Return in about 4 weeks (around 11/10/2019) for Smoking cessation f/u - prefer virtual.

## 2019-10-13 NOTE — Assessment & Plan Note (Signed)
Restart chantix, set quit date, counseling provided regarding habit replacement, NRT prn. F/u 1 month for recheck

## 2019-10-15 ENCOUNTER — Other Ambulatory Visit: Payer: Self-pay | Admitting: Family Medicine

## 2019-11-08 ENCOUNTER — Encounter: Payer: Self-pay | Admitting: Family Medicine

## 2019-11-10 ENCOUNTER — Telehealth: Payer: BC Managed Care – PPO | Admitting: Family Medicine

## 2020-01-02 ENCOUNTER — Other Ambulatory Visit: Payer: Self-pay | Admitting: Family Medicine

## 2020-01-03 NOTE — Telephone Encounter (Signed)
Patient needs appointment in next month for further refills.

## 2020-01-03 NOTE — Telephone Encounter (Signed)
Requested medication (s) are due for refill today: Yes  Requested medication (s) are on the active medication list: Yes  Last refill:  08/26/19  Future visit scheduled: No  Notes to clinic:  Roosvelt Maser pt.    Requested Prescriptions  Pending Prescriptions Disp Refills   traZODone (DESYREL) 100 MG tablet [Pharmacy Med Name: TRAZODONE 100MG  TABLETS] 90 tablet 0    Sig: TAKE 1 TABLET(100 MG) BY MOUTH AT BEDTIME AND AT NIGHT AS NEEDED FOR SLEEP      Psychiatry: Antidepressants - Serotonin Modulator Passed - 01/02/2020  9:06 PM      Passed - Completed PHQ-2 or PHQ-9 in the last 360 days      Passed - Valid encounter within last 6 months    Recent Outpatient Visits           2 months ago Cigarette smoker   [facility] [place], [place]   9 months ago Reactive depression   [facility] [place], [place]

## 2020-01-04 ENCOUNTER — Encounter: Payer: Self-pay | Admitting: Family Medicine

## 2020-01-04 NOTE — Telephone Encounter (Signed)
appt

## 2020-01-04 NOTE — Telephone Encounter (Signed)
Called pt to schedule, no answer, left vm sent mychart letter

## 2020-01-07 ENCOUNTER — Ambulatory Visit: Payer: BC Managed Care – PPO | Admitting: Family Medicine

## 2020-01-07 ENCOUNTER — Other Ambulatory Visit: Payer: Self-pay

## 2020-01-07 ENCOUNTER — Encounter: Payer: Self-pay | Admitting: Family Medicine

## 2020-01-07 VITALS — BP 110/70 | HR 71 | Temp 98.8°F | Ht 65.08 in | Wt 182.6 lb

## 2020-01-07 DIAGNOSIS — G43909 Migraine, unspecified, not intractable, without status migrainosus: Secondary | ICD-10-CM | POA: Insufficient documentation

## 2020-01-07 DIAGNOSIS — M5442 Lumbago with sciatica, left side: Secondary | ICD-10-CM

## 2020-01-07 DIAGNOSIS — F329 Major depressive disorder, single episode, unspecified: Secondary | ICD-10-CM | POA: Diagnosis not present

## 2020-01-07 DIAGNOSIS — G4700 Insomnia, unspecified: Secondary | ICD-10-CM | POA: Diagnosis not present

## 2020-01-07 DIAGNOSIS — Z23 Encounter for immunization: Secondary | ICD-10-CM | POA: Diagnosis not present

## 2020-01-07 MED ORDER — NAPROXEN 500 MG PO TABS: 500.0000 mg | ORAL_TABLET | Freq: Two times a day (BID) | ORAL | 3 refills | Status: DC

## 2020-01-07 MED ORDER — KETOROLAC TROMETHAMINE 60 MG/2ML IM SOLN: 60.0000 mg | Freq: Once | INTRAMUSCULAR | Status: DC

## 2020-01-07 MED ORDER — CYCLOBENZAPRINE HCL 10 MG PO TABS: 10.0000 mg | ORAL_TABLET | Freq: Every day | ORAL | 0 refills | Status: DC

## 2020-01-07 MED ORDER — TRAZODONE HCL 100 MG PO TABS: ORAL_TABLET | ORAL | 1 refills | Status: DC

## 2020-01-07 MED ORDER — ESCITALOPRAM OXALATE 5 MG PO TABS: ORAL_TABLET | ORAL | 1 refills | Status: DC

## 2020-01-07 NOTE — Patient Instructions (Signed)
Sciatica Rehab Ask your health care provider which exercises are safe for you. Do exercises exactly as told by your health care provider and adjust them as directed. It is normal to feel mild stretching, pulling, tightness, or discomfort as you do these exercises. Stop right away if you feel sudden pain or your pain gets worse. Do not begin these exercises until told by your health care provider. Stretching and range-of-motion exercises These exercises warm up your muscles and joints and improve the movement and flexibility of your hips and back. These exercises also help to relieve pain, numbness, and tingling. Sciatic nerve glide 1. Sit in a chair with your head facing down toward your chest. Place your hands behind your back. Let your shoulders slump forward. 2. Slowly straighten one of your legs while you tilt your head back as if you are looking toward the ceiling. Only straighten your leg as far as you can without making your symptoms worse. 3. Hold this position for __________ seconds. 4. Slowly return to the starting position. 5. Repeat with your other leg. Repeat __________ times. Complete this exercise __________ times a day. Knee to chest with hip adduction and internal rotation  1. Lie on your back on a firm surface with both legs straight. 2. Bend one of your knees and move it up toward your chest until you feel a gentle stretch in your lower back and buttock. Then, move your knee toward the shoulder that is on the opposite side from your leg. This is hip adduction and internal rotation. ? Hold your leg in this position by holding on to the front of your knee. 3. Hold this position for __________ seconds. 4. Slowly return to the starting position. 5. Repeat with your other leg. Repeat __________ times. Complete this exercise __________ times a day. Prone extension on elbows  1. Lie on your abdomen on a firm surface. A bed may be too soft for this exercise. 2. Prop yourself up on  your elbows. 3. Use your arms to help lift your chest up until you feel a gentle stretch in your abdomen and your lower back. ? This will place some of your body weight on your elbows. If this is uncomfortable, try stacking pillows under your chest. ? Your hips should stay down, against the surface that you are lying on. Keep your hip and back muscles relaxed. 4. Hold this position for __________ seconds. 5. Slowly relax your upper body and return to the starting position. Repeat __________ times. Complete this exercise __________ times a day. Strengthening exercises These exercises build strength and endurance in your back. Endurance is the ability to use your muscles for a long time, even after they get tired. Pelvic tilt This exercise strengthens the muscles that lie deep in the abdomen. 1. Lie on your back on a firm surface. Bend your knees and keep your feet flat on the floor. 2. Tense your abdominal muscles. Tip your pelvis up toward the ceiling and flatten your lower back into the floor. ? To help with this exercise, you may place a small towel under your lower back and try to push your back into the towel. 3. Hold this position for __________ seconds. 4. Let your muscles relax completely before you repeat this exercise. Repeat __________ times. Complete this exercise __________ times a day. Alternating arm and leg raises  1. Get on your hands and knees on a firm surface. If you are on a hard floor, you may want to use   padding, such as an exercise mat, to cushion your knees. 2. Line up your arms and legs. Your hands should be directly below your shoulders, and your knees should be directly below your hips. 3. Lift your left leg behind you. At the same time, raise your right arm and straighten it in front of you. ? Do not lift your leg higher than your hip. ? Do not lift your arm higher than your shoulder. ? Keep your abdominal and back muscles tight. ? Keep your hips facing the  ground. ? Do not arch your back. ? Keep your balance carefully, and do not hold your breath. 4. Hold this position for __________ seconds. 5. Slowly return to the starting position. 6. Repeat with your right leg and your left arm. Repeat __________ times. Complete this exercise __________ times a day. Posture and body mechanics Good posture and healthy body mechanics can help to relieve stress in your body's tissues and joints. Body mechanics refers to the movements and positions of your body while you do your daily activities. Posture is part of body mechanics. Good posture means:  Your spine is in its natural S-curve position (neutral).  Your shoulders are pulled back slightly.  Your head is not tipped forward. Follow these guidelines to improve your posture and body mechanics in your everyday activities. Standing   When standing, keep your spine neutral and your feet about hip width apart. Keep a slight bend in your knees. Your ears, shoulders, and hips should line up.  When you do a task in which you stand in one place for a long time, place one foot up on a stable object that is 2-4 inches (5-10 cm) high, such as a footstool. This helps keep your spine neutral. Sitting   When sitting, keep your spine neutral and keep your feet flat on the floor. Use a footrest, if necessary, and keep your thighs parallel to the floor. Avoid rounding your shoulders, and avoid tilting your head forward.  When working at a desk or a computer, keep your desk at a height where your hands are slightly lower than your elbows. Slide your chair under your desk so you are close enough to maintain good posture.  When working at a computer, place your monitor at a height where you are looking straight ahead and you do not have to tilt your head forward or downward to look at the screen. Resting  When lying down and resting, avoid positions that are most painful for you.  If you have pain with activities  such as sitting, bending, stooping, or squatting, lie in a position in which your body does not bend very much. For example, avoid curling up on your side with your arms and knees near your chest (fetal position).  If you have pain with activities such as standing for a long time or reaching with your arms, lie with your spine in a neutral position and bend your knees slightly. Try the following positions: ? Lying on your side with a pillow between your knees. ? Lying on your back with a pillow under your knees. Lifting   When lifting objects, keep your feet at least shoulder width apart and tighten your abdominal muscles.  Bend your knees and hips and keep your spine neutral. It is important to lift using the strength of your legs, not your back. Do not lock your knees straight out.  Always ask for help to lift heavy or awkward objects. This information is not   intended to replace advice given to you by your health care provider. Make sure you discuss any questions you have with your health care provider. Document Revised: 05/29/2018 Document Reviewed: 02/26/2018 Elsevier Patient Education  2020 [employer]  

## 2020-01-07 NOTE — Progress Notes (Signed)
BP 110/70   Pulse 71   Temp 98.8 F (37.1 C) (Oral)   Ht 5' 5.08" (1.653 m)   Wt 182 lb 9.6 oz (82.8 kg)   SpO2 98%   BMI 30.31 kg/m    Subjective:    Patient ID: Megan Mendoza, female    DOB: Mar 01, 1970, 49 y.o.   MRN: [id number]  HPI: Megan Mendoza is a 49 y.o. female  Chief Complaint  Patient presents with  . Depression    Lexapro and Trazadone  . Back Pain    started yesterday  . Insomnia   DEPRESSION Mood status: controlled Satisfied with current treatment?: yes Symptom severity: mild  Duration of current treatment : chronic Side effects: no Medication compliance: excellent compliance Psychotherapy/counseling: no  Previous psychiatric medications: lexapro Depressed mood: yes Anxious mood: no Anhedonia: no Significant weight loss or gain: no Insomnia: yes  Fatigue: no Feelings of worthlessness or guilt: no Impaired concentration/indecisiveness: no Suicidal ideations: no Hopelessness: no Crying spells: no Depression screen [facility] 2/9 01/07/2020 10/13/2019 04/07/2019  Decreased Interest 0 1 1  Down, Depressed, Hopeless 0 1 0  PHQ - 2 Score 0 2 1  Altered sleeping 3 2 2   Tired, decreased energy 3 3 2   Change in appetite 0 1 0  Feeling bad or failure about yourself  0 1 0  Trouble concentrating 0 0 0  Moving slowly or fidgety/restless 0 0 0  Suicidal thoughts 0 0 0  PHQ-9 Score 6 9 5    INSOMNIA Duration: chronic Satisfied with sleep quality: yes Difficulty falling asleep: no Difficulty staying asleep: no Waking a few hours after sleep onset: no Early morning awakenings: no Daytime hypersomnolence: no Wakes feeling refreshed: yes Good sleep hygiene: yes Apnea: no Snoring: no Depressed/anxious mood: yes Recent stress: yes Restless legs/nocturnal leg cramps: no Chronic pain/arthritis: no  BACK PAIN Duration: yesterday Mechanism of injury: unknown Location: bilateral and low back Onset: sudden Severity: 4/10 Quality: sharp and  shooting Frequency: intermittent Radiation: L leg below the knee Aggravating factors: movement Alleviating factors: sitting Status: worse Treatments attempted: ibuprofen  Relief with NSAIDs?: No NSAIDs Taken Nighttime pain:  yes Paresthesias / decreased sensation:  no Bowel / bladder incontinence:  no Fevers:  no Dysuria / urinary frequency:  no   Relevant past medical, surgical, family and social history reviewed and updated as indicated. Interim medical history since our last visit reviewed. Allergies and medications reviewed and updated.  Review of Systems  Constitutional: Negative.   Respiratory: Negative.   Cardiovascular: Negative.   Gastrointestinal: Negative.   Musculoskeletal: Positive for back pain and myalgias. Negative for arthralgias, gait problem, joint swelling, neck pain and neck stiffness.  Skin: Negative.   Neurological: Negative.   Psychiatric/Behavioral: Negative.     Per HPI unless specifically indicated above     Objective:    BP 110/70   Pulse 71   Temp 98.8 F (37.1 C) (Oral)   Ht 5' 5.08" (1.653 m)   Wt 182 lb 9.6 oz (82.8 kg)   SpO2 98%   BMI 30.31 kg/m   Wt Readings from Last 3 Encounters:  01/07/20 182 lb 9.6 oz (82.8 kg)  10/13/19 177 lb (80.3 kg)  04/07/19 190 lb (86.2 kg)    Physical Exam Vitals and nursing note reviewed.  Constitutional:      General: She is not in acute distress.    Appearance: Normal appearance. She is not ill-appearing, toxic-appearing or diaphoretic.  HENT:     Head:  Normocephalic and atraumatic.     Right Ear: External ear normal.     Left Ear: External ear normal.     Nose: Nose normal.     Mouth/Throat:     Mouth: Mucous membranes are moist.     Pharynx: Oropharynx is clear.  Eyes:     General: No scleral icterus.       Right eye: No discharge.        Left eye: No discharge.     Extraocular Movements: Extraocular movements intact.     Conjunctiva/sclera: Conjunctivae normal.     Pupils: Pupils  are equal, round, and reactive to light.  Cardiovascular:     Rate and Rhythm: Normal rate and regular rhythm.     Pulses: Normal pulses.     Heart sounds: Normal heart sounds. No murmur heard.  No friction rub. No gallop.   Pulmonary:     Effort: Pulmonary effort is normal. No respiratory distress.     Breath sounds: Normal breath sounds. No stridor. No wheezing, rhonchi or rales.  Chest:     Chest wall: No tenderness.  Musculoskeletal:        General: Normal range of motion.     Cervical back: Normal range of motion and neck supple.  Skin:    General: Skin is warm and dry.     Capillary Refill: Capillary refill takes less than 2 seconds.     Coloration: Skin is not jaundiced or pale.     Findings: No bruising, erythema, lesion or rash.  Neurological:     General: No focal deficit present.     Mental Status: She is alert and oriented to person, place, and time. Mental status is at baseline.  Psychiatric:        Mood and Affect: Mood normal.        Behavior: Behavior normal.        Thought Content: Thought content normal.        Judgment: Judgment normal.     Results for orders placed or performed in visit on 04/07/19  Microscopic Examination   URINE  Result Value Ref Range   WBC, UA 0-5 0 - 5 /hpf   RBC 0-2 0 - 2 /hpf   Epithelial Cells (non renal) 0-10 0 - 10 /hpf   Bacteria, UA Moderate (A) None seen/Few  Urine Culture, Reflex   URINE  Result Value Ref Range   Urine Culture, Routine Final report    Organism ID, Bacteria Comment   CBC with Differential/Platelet  Result Value Ref Range   WBC 7.4 3.4 - 10.8 x10E3/uL   RBC 5.11 3.77 - 5.28 x10E6/uL   Hemoglobin 15.3 11.1 - 15.9 g/dL   Hematocrit 37.6 (H) 28.3 - 46.6 %   MCV 91 79 - 97 fL   MCH 29.9 26.6 - 33.0 pg   MCHC 32.8 31 - 35 g/dL   RDW 15.1 76.1 - 60.7 %   Platelets 217 150 - 450 x10E3/uL   Neutrophils 57 Not Estab. %   Lymphs 30 Not Estab. %   Monocytes 7 Not Estab. %   Eos 5 Not Estab. %   Basos 1  Not Estab. %   Neutrophils Absolute 4.3 1.40 - 7.00 x10E3/uL   Lymphocytes Absolute 2.2 0 - 3 x10E3/uL   Monocytes Absolute 0.5 0 - 0 x10E3/uL   EOS (ABSOLUTE) 0.3 0.0 - 0.4 x10E3/uL   Basophils Absolute 0.1 0 - 0 x10E3/uL   Immature Granulocytes 0 Not Estab. %  Immature Grans (Abs) 0.0 0.0 - 0.1 x10E3/uL  Comprehensive metabolic panel  Result Value Ref Range   Glucose 80 65 - 99 mg/dL   BUN 17 6 - 24 mg/dL   Creatinine, Ser 4.090.95 0.57 - 1.00 mg/dL   GFR calc non Af Amer 71 >59 mL/min/1.73   GFR calc Af Amer 81 >59 mL/min/1.73   BUN/Creatinine Ratio 18 9 - 23   Sodium 140 134 - 144 mmol/L   Potassium 4.5 3.5 - 5.2 mmol/L   Chloride 101 96 - 106 mmol/L   CO2 23 20 - 29 mmol/L   Calcium 10.0 8.7 - 10.2 mg/dL   Total Protein 7.2 6.0 - 8.5 g/dL   Albumin 4.6 3.8 - 4.8 g/dL   Globulin, Total 2.6 1.5 - 4.5 g/dL   Albumin/Globulin Ratio 1.8 1.2 - 2.2   Bilirubin Total 0.3 0.0 - 1.2 mg/dL   Alkaline Phosphatase 93 39 - 117 IU/L   AST 20 0 - 40 IU/L   ALT 27 0 - 32 IU/L  Lipid Panel w/o Chol/HDL Ratio  Result Value Ref Range   Cholesterol, Total 181 100 - 199 mg/dL   Triglycerides 811128 0 - 149 mg/dL   HDL 45 >91>39 mg/dL   VLDL Cholesterol Cal 23 5 - 40 mg/dL   LDL Chol Calc (NIH) 478113 (H) 0 - 99 mg/dL  TSH  Result Value Ref Range   TSH 2.840 0.450 - 4.500 uIU/mL  UA/M w/rflx Culture, Routine   Specimen: Urine   URINE  Result Value Ref Range   Specific Gravity, UA 1.020 1.005 - 1.030   pH, UA 5.0 5.0 - 7.5   Color, UA Yellow Yellow   Appearance Ur Clear Clear   Leukocytes,UA Negative Negative   Protein,UA Negative Negative/Trace   Glucose, UA Negative Negative   Ketones, UA Negative Negative   RBC, UA 2+ (A) Negative   Bilirubin, UA Negative Negative   Urobilinogen, Ur 0.2 0.2 - 1.0 mg/dL   Nitrite, UA Negative Negative   Microscopic Examination See below:    Urinalysis Reflex Comment   Lipase  Result Value Ref Range   Lipase 33 14 - 72 U/L  HIV Antibody (routine testing  w rflx)  Result Value Ref Range   HIV Screen 4th Generation wRfx Non Reactive Non Reactive      Assessment & Plan:   Problem List Items Addressed This Visit      Other   Depression - Primary    Under good control on current regimen. Continue current regimen. Continue to monitor. Call with any concerns. Refills given.        Relevant Medications   traZODone (DESYREL) 100 MG tablet   escitalopram (LEXAPRO) 5 MG tablet   Insomnia    Under good control on current regimen. Continue current regimen. Continue to monitor. Call with any concerns. Refills given.         Other Visit Diagnoses    Acute bilateral low back pain with left-sided sciatica       Will treat with exercises, flexeril and naproxen. Call with any concerns or if not getting better.    Relevant Medications   traZODone (DESYREL) 100 MG tablet   escitalopram (LEXAPRO) 5 MG tablet   cyclobenzaprine (FLEXERIL) 10 MG tablet   naproxen (NAPROSYN) 500 MG tablet   Need for Tdap vaccination       Tdap given today.    Relevant Orders   Tdap vaccine greater than or equal to 7yo IM  Need for influenza vaccination       Flu shot given today.    Relevant Orders   Flu Vaccine QUAD 6+ mos PF IM (Fluarix Quad PF)       Follow up plan: Return in about 6 months (around 07/06/2020) for Physical.

## 2020-01-07 NOTE — Assessment & Plan Note (Signed)
Under good control on current regimen. Continue current regimen. Continue to monitor. Call with any concerns. Refills given.   

## 2020-01-23 ENCOUNTER — Ambulatory Visit
Admission: EM | Admit: 2020-01-23 | Discharge: 2020-01-23 | Disposition: A | Discharge status: Home / Self Care | Payer: BC Managed Care – PPO | Attending: Emergency Medicine | Admitting: Emergency Medicine

## 2020-01-23 ENCOUNTER — Other Ambulatory Visit: Payer: Self-pay

## 2020-01-23 DIAGNOSIS — M7661 Achilles tendinitis, right leg: Secondary | ICD-10-CM | POA: Diagnosis not present

## 2020-01-23 MED ORDER — METHYLPREDNISOLONE 4 MG PO TBPK: ORAL_TABLET | ORAL | 0 refills | Status: DC

## 2020-01-23 NOTE — Discharge Instructions (Addendum)
The Medrol Dosepak according back instructions.  Stretch your Achilles tendon 2-3 times a day as we discussed.  If your symptoms continue you may need to see orthopedics.

## 2020-01-23 NOTE — ED Triage Notes (Signed)
Pt presents with right foot pain since last night, non injury related.

## 2020-01-23 NOTE — ED Provider Notes (Signed)
[facility]    CSN: [id number] Arrival date & time: 01/23/20  2482      History   Chief Complaint Chief Complaint  Patient presents with  . Foot Pain    HPI Megan Mendoza is a 49 y.o. female.   HPI   49 year old female here for evaluation of pain in her right heel.  Patient states that she woke up this morning that way and is unable to put weight on her foot without pain in the back part of her ankle.  She denies redness or injury.  She states that she has some mild tingling in her toes.  No swelling.  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  Past Medical History:  Diagnosis Date  . Insomnia   . Perimenopausal     Patient Active Problem List   Diagnosis Date Noted  . Migraines 01/07/2020  . Cigarette smoker 10/13/2019  . Insomnia   . Depression 04/07/2019    Past Surgical History:  Procedure Laterality Date  . CESAREAN SECTION     2  . TUBAL LIGATION      OB History   No obstetric history on file.      Home Medications    Prior to Admission medications   Medication Sig Start Date End Date Taking? Authorizing Provider  cyclobenzaprine (FLEXERIL) 10 MG tablet Take 1 tablet (10 mg total) by mouth at bedtime. 01/07/20   Johnson, Megan P, DO  escitalopram (LEXAPRO) 5 MG tablet TAKE 1 TABLET(5 MG) BY MOUTH DAILY 01/07/20   Johnson, Megan P, DO  methylPREDNISolone (MEDROL DOSEPAK) 4 MG TBPK tablet Take according to the package insert. 01/23/20   Becky Augusta, NP  naproxen (NAPROSYN) 500 MG tablet Take 1 tablet (500 mg total) by mouth 2 (two) times daily with a meal. 01/07/20   Johnson, Megan P, DO  traZODone (DESYREL) 100 MG tablet TAKE 1 TABLET(100 MG) BY MOUTH AT BEDTIME AND AT NIGHT AS NEEDED FOR SLEEP 01/07/20   Johnson, Megan P, DO  varenicline (CHANTIX STARTING MONTH PAK) 0.5 MG X 11 & 1 MG X 42 tablet Take one 0.5 mg tablet by mouth once daily for 3  days, then increase to one 0.5 mg tablet twice daily for 4 days, then increase to one 1 mg tablet twice daily. Patient not taking: Reported on 01/07/2020 10/13/19   Particia Nearing, PA-C    Family History Family History  Problem Relation Age of Onset  . Heart disease Mother   . Lung cancer Father   . Asthma Son   . ADD / ADHD Son   . Breast cancer Neg Hx     Social History Social History   Tobacco Use  . Smoking status: Former Smoker    Packs/day: 1.00    Types: Cigarettes  . Smokeless tobacco: Never Used  . Tobacco comment: pt states she stopped smoking 4 days ago  Vaping Use  . Vaping Use: Never used  Substance Use Topics  . Alcohol use: Yes    Comment: ocassionally  . Drug use: Never     Allergies   Aspirin   Review of Systems Review of Systems  Constitutional: Negative for activity change, appetite change and fever.  Musculoskeletal: Positive for myalgias. Negative for arthralgias.  Skin: Negative for color change, rash and wound.  Neurological: Positive for numbness. Negative for weakness.  Hematological: Negative.  Psychiatric/Behavioral: Negative.      Physical Exam Triage Vital Signs ED Triage Vitals  Enc Vitals Group     BP 01/23/20 1109 130/79     Pulse Rate 01/23/20 1109 68     Resp 01/23/20 1109 17     Temp 01/23/20 1109 98.5 F (36.9 C)     Temp Source 01/23/20 1109 Oral     SpO2 01/23/20 1109 97 %     Weight --      Height --      Head Circumference --      Peak Flow --      Pain Score 01/23/20 1108 9     Pain Loc --      Pain Edu? --      Excl. in GC? --    No data found.  Updated Vital Signs BP 130/79 (BP Location: Left Arm)   Pulse 68   Temp 98.5 F (36.9 C) (Oral)   Resp 17   SpO2 97%   Visual Acuity Right Eye Distance:   Left Eye Distance:   Bilateral Distance:    Right Eye Near:   Left Eye Near:    Bilateral Near:     Physical Exam Vitals and nursing note reviewed.  Constitutional:      General: She is  not in acute distress.    Appearance: Normal appearance. She is normal weight. She is not toxic-appearing.  HENT:     Head: Normocephalic and atraumatic.  Eyes:     General: No scleral icterus.    Extraocular Movements: Extraocular movements intact.     Conjunctiva/sclera: Conjunctivae normal.     Pupils: Pupils are equal, round, and reactive to light.  Musculoskeletal:        General: Tenderness present. No swelling, deformity or signs of injury. Normal range of motion.  Skin:    General: Skin is dry.     Capillary Refill: Capillary refill takes less than 2 seconds.     Findings: No erythema or rash.  Neurological:     General: No focal deficit present.     Mental Status: She is alert and oriented to person, place, and time.  Psychiatric:        Mood and Affect: Mood normal.        Behavior: Behavior normal.        Thought Content: Thought content normal.        Judgment: Judgment normal.      UC Treatments / Results  Labs (all labs ordered are listed, but only abnormal results are displayed) Labs Reviewed - No data to display  EKG   Radiology No results found.  Procedures Procedures (including critical care time)  Medications Ordered in UC Medications - No data to display  Initial Impression / Assessment and Plan / UC Course  I have reviewed the triage vital signs and the nursing notes.  Pertinent labs & imaging results that were available during my care of the patient were reviewed by me and considered in my medical decision making (see chart for details).   Patient is here complaining of tenderness in her right ankle along the Achilles and on the lateral aspect.  Patient has tenderness to palpation of the Achilles tendon along the body of the tendon but not at the insertion site.  Patient's DP and PT pulses are 2+.  Patient is full range of motion of her foot and ankle.  Patient has minimal pain with plantar flexion but pain increases with  dorsiflexion.  Patient  has no tenderness to palpation of either malleoli or the base of the fifth metatarsal.  There is no tenderness in the calf or muscle defect palpated.  No redness or warmth noted.  Patient states that she does not run or has not had any previous injuries.  Patient does sit at a desk job where she sits with her legs folded up underneath her with her calf in a contracted position frequently.  Patient symptoms are consistent with Achilles tendinitis.  Will treat with low-dose prednisone Dosepak and physical therapy at home.   Final Clinical Impressions(s) / UC Diagnoses   Final diagnoses:  Achilles tendinitis of right lower extremity     Discharge Instructions     The Medrol Dosepak according back instructions.  Stretch your Achilles tendon 2-3 times a day as we discussed.  If your symptoms continue you may need to see orthopedics.    ED Prescriptions    Medication Sig Dispense Auth. Provider   methylPREDNISolone (MEDROL DOSEPAK) 4 MG TBPK tablet Take according to the package insert. 1 each Becky Augusta, NP     PDMP not reviewed this encounter.   Becky Augusta, NP 01/23/20 1151

## 2020-03-21 ENCOUNTER — Other Ambulatory Visit: Payer: Self-pay | Admitting: Family Medicine

## 2020-07-04 ENCOUNTER — Other Ambulatory Visit: Payer: Self-pay | Admitting: Family Medicine

## 2020-10-01 ENCOUNTER — Other Ambulatory Visit: Payer: Self-pay | Admitting: Nurse Practitioner

## 2020-10-01 NOTE — Telephone Encounter (Signed)
Attempted to call pt. LM on VM to call office to schedule appt. Call back number provided. Last RF 03/21/20 # 90. RF is due but pt needs appointment. Requested Prescriptions  Pending Prescriptions Disp Refills   traZODone (DESYREL) 100 MG tablet [Pharmacy Med Name: TRAZODONE 100MG  TABLETS] 90 tablet 0    Sig: TAKE 1 TABLET BY MOUTH AT BEDTIME AS NEEDED FOR SLEEP     Psychiatry: Antidepressants - Serotonin Modulator Failed - 10/01/2020  3:35 AM      Failed - Valid encounter within last 6 months    Recent Outpatient Visits           8 months ago Reactive depression   [facility] [place], [place], [place]   11 months ago Cigarette smoker   [facility] [place], [place], [place]   1 year ago Reactive depression   [facility] [place], [place]              Passed - Completed PHQ-2 or PHQ-9 in the last 360 days

## 2020-10-01 NOTE — Telephone Encounter (Signed)
Attempted to call pt. LM on VM to call office to schedule appt. Call back number provided. Last RF 07/04/20 #90 RF is due needs OV. Requested Prescriptions  Pending Prescriptions Disp Refills   escitalopram (LEXAPRO) 5 MG tablet [Pharmacy Med Name: ESCITALOPRAM 5MG  TABLETS] 90 tablet 0    Sig: TAKE 1 TABLET(5 MG) BY MOUTH DAILY     Psychiatry:  Antidepressants - SSRI Failed - 10/01/2020  3:35 AM      Failed - Valid encounter within last 6 months    Recent Outpatient Visits           8 months ago Reactive depression   [facility] [place], [place], [place]   11 months ago Cigarette smoker   [facility] [place], [place], [place]   1 year ago Reactive depression   [facility] [place], [place]              Passed - Completed PHQ-2 or PHQ-9 in the last 360 days

## 2020-10-02 NOTE — Telephone Encounter (Signed)
Patient overdue for appointment, please call and schedule.

## 2020-10-02 NOTE — Telephone Encounter (Signed)
Patient overdue for appointment, please call to schedule.

## 2020-10-27 ENCOUNTER — Encounter: Payer: Self-pay | Admitting: Nurse Practitioner

## 2020-10-27 ENCOUNTER — Other Ambulatory Visit: Payer: Self-pay

## 2020-10-27 ENCOUNTER — Ambulatory Visit: Payer: BC Managed Care – PPO | Admitting: Nurse Practitioner

## 2020-10-27 VITALS — BP 114/78 | HR 71 | Temp 98.9°F | Ht 65.08 in | Wt 181.0 lb

## 2020-10-27 DIAGNOSIS — Z23 Encounter for immunization: Secondary | ICD-10-CM

## 2020-10-27 DIAGNOSIS — F329 Major depressive disorder, single episode, unspecified: Secondary | ICD-10-CM | POA: Diagnosis not present

## 2020-10-27 DIAGNOSIS — F17210 Nicotine dependence, cigarettes, uncomplicated: Secondary | ICD-10-CM | POA: Diagnosis not present

## 2020-10-27 MED ORDER — ESCITALOPRAM OXALATE 5 MG PO TABS: ORAL_TABLET | ORAL | 1 refills | Status: DC

## 2020-10-27 MED ORDER — BUPROPION HCL ER (SR) 100 MG PO TB12: 100.0000 mg | ORAL_TABLET | Freq: Every day | ORAL | 0 refills | Status: DC

## 2020-10-27 MED ORDER — TRAZODONE HCL 100 MG PO TABS: ORAL_TABLET | ORAL | 1 refills | Status: DC

## 2020-10-27 NOTE — Assessment & Plan Note (Signed)
Chronic.  Controlled.  Continue with current medication regimen of Lexapro 5mg  daily and Trazodone 50mg  nightly.  Refills sent today.  Return to clinic in 6 months for reevaluation.  Call sooner if concerns arise.

## 2020-10-27 NOTE — Assessment & Plan Note (Signed)
Patient would like chantix but due to the recall will try Wellbutrin for smoking cessation.  Side effects and benefits of medication discussed for patient during visit.  Recommend taking medication for 2 weeks then picking a day to stop smoking.  Can increase dose of medication if necessary to help with cravings. Follow up in 1 month for reevaluation.

## 2020-10-27 NOTE — Progress Notes (Signed)
BP 114/78   Pulse 71   Temp 98.9 F (37.2 C) (Oral)   Ht 5' 5.08" (1.653 m)   Wt 181 lb (82.1 kg)   SpO2 97%   BMI 30.05 kg/m    Subjective:    Patient ID: Megan Mendoza, female    DOB: 1970/04/10, 50 y.o.   MRN: [id number]  HPI: Megan Mendoza is a 50 y.o. female  Chief Complaint  Patient presents with   Depression   DEPRESSION Patient presents to clinic for follow up on her depression.  Patient states the Lexapro and Trazodone are working well for her.  Denies SI.   Flowsheet Row Office Visit from 10/27/2020 in [facility]  PHQ-9 Total Score 3      SMOKING CESSATION Patient is ready to quit smoking.  She would like chantix but knows it has been recalled.    Relevant past medical, surgical, family and social history reviewed and updated as indicated. Interim medical history since our last visit reviewed. Allergies and medications reviewed and updated.  Review of Systems  Psychiatric/Behavioral:  Positive for dysphoric mood. Negative for suicidal ideas.    Per HPI unless specifically indicated above     Objective:    BP 114/78   Pulse 71   Temp 98.9 F (37.2 C) (Oral)   Ht 5' 5.08" (1.653 m)   Wt 181 lb (82.1 kg)   SpO2 97%   BMI 30.05 kg/m   Wt Readings from Last 3 Encounters:  10/27/20 181 lb (82.1 kg)  01/07/20 182 lb 9.6 oz (82.8 kg)  10/13/19 177 lb (80.3 kg)    Physical Exam Vitals and nursing note reviewed.  Constitutional:      General: She is not in acute distress.    Appearance: Normal appearance. She is normal weight. She is not ill-appearing, toxic-appearing or diaphoretic.  HENT:     Head: Normocephalic.     Right Ear: External ear normal.     Left Ear: External ear normal.     Nose: Nose normal.     Mouth/Throat:     Mouth: Mucous membranes are moist.     Pharynx: Oropharynx is clear.  Eyes:     General:        Right eye: No discharge.        Left eye: No discharge.     Extraocular Movements: Extraocular  movements intact.     Conjunctiva/sclera: Conjunctivae normal.     Pupils: Pupils are equal, round, and reactive to light.  Cardiovascular:     Rate and Rhythm: Normal rate and regular rhythm.     Heart sounds: No murmur heard. Pulmonary:     Effort: Pulmonary effort is normal. No respiratory distress.     Breath sounds: Normal breath sounds. No wheezing or rales.  Musculoskeletal:     Cervical back: Normal range of motion and neck supple.  Skin:    General: Skin is warm and dry.     Capillary Refill: Capillary refill takes less than 2 seconds.  Neurological:     General: No focal deficit present.     Mental Status: She is alert and oriented to person, place, and time. Mental status is at baseline.  Psychiatric:        Mood and Affect: Mood normal.        Behavior: Behavior normal.        Thought Content: Thought content normal.        Judgment: Judgment normal.  Results for orders placed or performed in visit on 04/07/19  Microscopic Examination   URINE  Result Value Ref Range   WBC, UA 0-5 0 - 5 /hpf   RBC 0-2 0 - 2 /hpf   Epithelial Cells (non renal) 0-10 0 - 10 /hpf   Bacteria, UA Moderate (A) None seen/Few  Urine Culture, Reflex   URINE  Result Value Ref Range   Urine Culture, Routine Final report    Organism ID, Bacteria Comment   CBC with Differential/Platelet  Result Value Ref Range   WBC 7.4 3.4 - 10.8 x10E3/uL   RBC 5.11 3.77 - 5.28 x10E6/uL   Hemoglobin 15.3 11.1 - 15.9 g/dL   Hematocrit 95.6 (H) 21.3 - 46.6 %   MCV 91 79 - 97 fL   MCH 29.9 26.6 - 33.0 pg   MCHC 32.8 31.5 - 35.7 g/dL   RDW 08.6 57.8 - 46.9 %   Platelets 217 150 - 450 x10E3/uL   Neutrophils 57 Not Estab. %   Lymphs 30 Not Estab. %   Monocytes 7 Not Estab. %   Eos 5 Not Estab. %   Basos 1 Not Estab. %   Neutrophils Absolute 4.3 1.4 - 7.0 x10E3/uL   Lymphocytes Absolute 2.2 0.7 - 3.1 x10E3/uL   Monocytes Absolute 0.5 0.1 - 0.9 x10E3/uL   EOS (ABSOLUTE) 0.3 0.0 - 0.4 x10E3/uL    Basophils Absolute 0.1 0.0 - 0.2 x10E3/uL   Immature Granulocytes 0 Not Estab. %   Immature Grans (Abs) 0.0 0.0 - 0.1 x10E3/uL  Comprehensive metabolic panel  Result Value Ref Range   Glucose 80 65 - 99 mg/dL   BUN 17 6 - 24 mg/dL   Creatinine, Ser 6.29 0.57 - 1.00 mg/dL   GFR calc non Af Amer 71 >59 mL/min/1.73   GFR calc Af Amer 81 >59 mL/min/1.73   BUN/Creatinine Ratio 18 9 - 23   Sodium 140 134 - 144 mmol/L   Potassium 4.5 3.5 - 5.2 mmol/L   Chloride 101 96 - 106 mmol/L   CO2 23 20 - 29 mmol/L   Calcium 10.0 8.7 - 10.2 mg/dL   Total Protein 7.2 6.0 - 8.5 g/dL   Albumin 4.6 3.8 - 4.8 g/dL   Globulin, Total 2.6 1.5 - 4.5 g/dL   Albumin/Globulin Ratio 1.8 1.2 - 2.2   Bilirubin Total 0.3 0.0 - 1.2 mg/dL   Alkaline Phosphatase 93 39 - 117 IU/L   AST 20 0 - 40 IU/L   ALT 27 0 - 32 IU/L  Lipid Panel w/o Chol/HDL Ratio  Result Value Ref Range   Cholesterol, Total 181 100 - 199 mg/dL   Triglycerides 528 0 - 149 mg/dL   HDL 45 >41 mg/dL   VLDL Cholesterol Cal 23 5 - 40 mg/dL   LDL Chol Calc (NIH) 324 (H) 0 - 99 mg/dL  TSH  Result Value Ref Range   TSH 2.840 0.450 - 4.500 uIU/mL  UA/M w/rflx Culture, Routine   Specimen: Urine   URINE  Result Value Ref Range   Specific Gravity, UA 1.020 1.005 - 1.030   pH, UA 5.0 5.0 - 7.5   Color, UA Yellow Yellow   Appearance Ur Clear Clear   Leukocytes,UA Negative Negative   Protein,UA Negative Negative/Trace   Glucose, UA Negative Negative   Ketones, UA Negative Negative   RBC, UA 2+ (A) Negative   Bilirubin, UA Negative Negative   Urobilinogen, Ur 0.2 0.2 - 1.0 mg/dL   Nitrite, UA Negative  Negative   Microscopic Examination See below:    Urinalysis Reflex Comment   Lipase  Result Value Ref Range   Lipase 33 14 - 72 U/L  HIV Antibody (routine testing w rflx)  Result Value Ref Range   HIV Screen 4th Generation wRfx Non Reactive Non Reactive      Assessment & Plan:   Problem List Items Addressed This Visit       Other    Depression    Chronic.  Controlled.  Continue with current medication regimen of Lexapro 5mg  daily and Trazodone 50mg  nightly.  Refills sent today.  Return to clinic in 6 months for reevaluation.  Call sooner if concerns arise.        Relevant Medications   traZODone (DESYREL) 100 MG tablet   escitalopram (LEXAPRO) 5 MG tablet   buPROPion ER (WELLBUTRIN SR) 100 MG 12 hr tablet   Cigarette smoker    Patient would like chantix but due to the recall will try Wellbutrin for smoking cessation.  Side effects and benefits of medication discussed for patient during visit.  Recommend taking medication for 2 weeks then picking a day to stop smoking.  Can increase dose of medication if necessary to help with cravings. Follow up in 1 month for reevaluation.       Other Visit Diagnoses     Need for influenza vaccination    -  Primary   Relevant Orders   Flu Vaccine QUAD 71mo+IM (Fluarix, Fluzone & Alfiuria Quad PF) (Completed)        Follow up plan: Return in about 1 month (around 11/26/2020) for smoking cessation (virtual).

## 2020-11-05 ENCOUNTER — Telehealth: Payer: Self-pay | Admitting: Nurse Practitioner

## 2020-11-06 ENCOUNTER — Other Ambulatory Visit: Payer: Self-pay | Admitting: Family Medicine

## 2020-11-06 NOTE — Telephone Encounter (Signed)
See message from [facility]

## 2020-11-06 NOTE — Telephone Encounter (Signed)
Requested medication (s) are due for refill today:   Yes  Requested medication (s) are on the active medication list:   Yes but there is a dose discrepancy  Future visit scheduled:   Yes   Last ordered: 10/27/2020 #90, 1  Returned because [employer] note indicates the dose is 50 mg however this is for 100 mg.   Needing clarification on dose.     Requested Prescriptions  Pending Prescriptions Disp Refills   traZODone (DESYREL) 100 MG tablet [Pharmacy Med Name: TRAZODONE 100MG  TABLETS] 30 tablet     Sig: TAKE 1 TABLET BY MOUTH AT BEDTIME AS NEEDED FOR SLEEP     Psychiatry: Antidepressants - Serotonin Modulator Passed - 11/05/2020  3:34 AM      Passed - Completed PHQ-2 or PHQ-9 in the last 360 days      Passed - Valid encounter within last 6 months    Recent Outpatient Visits           1 week ago Need for influenza vaccination   [facility], NP   10 months ago Reactive depression   [facility], [place], DO   1 year ago Cigarette smoker   [facility], Particia Nearing   1 year ago Reactive depression   [facility] [place], [place], [place]       Future Appointments             In 3 weeks [place], NP [facility], [facility]   In 5 months [facility], NP [facility], [facility]

## 2020-11-06 NOTE — Telephone Encounter (Signed)
Requested by interface surescripts. Receipt confirmed by pharmacy 10/27/20 at 2:56 pm.

## 2020-11-07 ENCOUNTER — Other Ambulatory Visit: Payer: Self-pay | Admitting: Nurse Practitioner

## 2020-11-07 NOTE — Telephone Encounter (Unsigned)
Copied from CRM [id number]. Topic: Quick Communication - Rx Refill/Question >> Nov 07, 2020 10:49 AM Gaetana Michaelis A wrote: Medication: escitalopram (LEXAPRO) 5 MG tablet [[id number]]   Has the patient contacted their pharmacy? Yes.   (Agent: If no, request that the patient contact the pharmacy for the refill.) (Agent: If yes, when and what did the pharmacy advise?)  Preferred Pharmacy (with phone number or street name): [id number] - [place], [place] - [address] [place] [address] AT [facility] [address]  Phone:  [phone] Fax:  [phone]   Has the patient been seen for an appointment in the last year OR does the patient have an upcoming appointment? Yes.    Agent: Please be advised that RX refills may take up to 3 business days. We ask that you follow-up with your pharmacy.

## 2020-11-07 NOTE — Telephone Encounter (Signed)
Patient called, left VM to return the call to the office to discuss refill request. [facility] called, placed on hold x 5 minutes, had to hang up the line due to extended hold time. Medication was sent to [facility] on 10/27/20 #90/1 refill. Will refuse this request. If patient returns call, notify her.

## 2020-11-09 MED ORDER — TRAZODONE HCL 100 MG PO TABS: 100.0000 mg | ORAL_TABLET | Freq: Every day | ORAL | 1 refills | Status: DC

## 2020-11-09 NOTE — Addendum Note (Signed)
Addended by: Larae Grooms on: 11/09/2020 08:32 PM   Modules accepted: Orders

## 2020-11-09 NOTE — Telephone Encounter (Signed)
Medication sent to the pharmacy.

## 2020-11-09 NOTE — Telephone Encounter (Signed)
Pt called saying she uses 100 mg of the trazadone not 50 mg's as the rx was sent in for.

## 2020-11-09 NOTE — Telephone Encounter (Signed)
Pt called saying the refill of Lexapro was never filled on the 9th and the pharmacy told her they could not fill it but did not give her a reason  CB#  [phone]

## 2020-11-09 NOTE — Telephone Encounter (Signed)
Routing to provider to advise.  

## 2020-11-14 DIAGNOSIS — Z1211 Encounter for screening for malignant neoplasm of colon: Secondary | ICD-10-CM

## 2020-11-20 DIAGNOSIS — Z1211 Encounter for screening for malignant neoplasm of colon: Secondary | ICD-10-CM | POA: Diagnosis not present

## 2020-11-24 ENCOUNTER — Other Ambulatory Visit: Payer: Self-pay | Admitting: Nurse Practitioner

## 2020-11-24 LAB — COLOGUARD: Cologuard: NEGATIVE

## 2020-11-24 NOTE — Progress Notes (Signed)
Please let patient know that her cologuard test was negative.

## 2020-11-24 NOTE — Telephone Encounter (Signed)
Requested medication (s) are due for refill today: yes  Requested medication (s) are on the active medication list: yes  Last refill:  10/27/20 #30 0 refills  Future visit scheduled: yes in 3 days  Notes to clinic:  last encounter 10 months ago . Do you want to give 3 days worth  of medication per protocol or can patient get courtesy refill # 30 ?      Requested Prescriptions  Pending Prescriptions Disp Refills   buPROPion ER (WELLBUTRIN SR) 100 MG 12 hr tablet [Pharmacy Med Name: BUPROPION SR 100MG  TABLETS (12 H)] 30 tablet 0    Sig: TAKE 1 TABLET(100 MG) BY MOUTH DAILY     Psychiatry: Antidepressants - bupropion Passed - 11/24/2020  3:35 AM      Passed - Completed PHQ-2 or PHQ-9 in the last 360 days      Passed - Last BP in normal range    BP Readings from Last 1 Encounters:  10/27/20 114/78          Passed - Valid encounter within last 6 months    Recent Outpatient Visits           4 weeks ago Need for influenza vaccination   [facility], NP   10 months ago Reactive depression   [facility] [place], [place], DO   1 year ago Cigarette smoker   [facility], [place]   1 year ago Reactive depression   [facility], [place], [place]       Future Appointments             In 3 days [place], NP [facility], [facility]   In 5 months [facility], NP [facility], [facility]

## 2020-11-27 ENCOUNTER — Telehealth (INDEPENDENT_AMBULATORY_CARE_PROVIDER_SITE_OTHER): Payer: BC Managed Care – PPO | Admitting: Nurse Practitioner

## 2020-11-27 DIAGNOSIS — F17210 Nicotine dependence, cigarettes, uncomplicated: Secondary | ICD-10-CM

## 2020-11-27 MED ORDER — BUPROPION HCL ER (SR) 200 MG PO TB12
200.0000 mg | ORAL_TABLET | Freq: Every day | ORAL | 1 refills | Status: DC
Start: 2020-11-27 — End: 2021-04-26

## 2020-11-27 NOTE — Progress Notes (Signed)
There were no vitals taken for this visit.   Subjective:    Patient ID: Megan Mendoza, female    DOB: [birth year], 50 y.o.   MRN: [id number]  HPI: Megan Mendoza is a 50 y.o. female  Chief Complaint  Patient presents with   Nicotine Dependence   SMOKING CESSATION Patient states she is still smoking.  She states the Wellbutrin is helping with her cravings.  Per patient now she needs to change the habit of smoking.    Denies HA, CP, SOB, dizziness, palpitations, visual changes, and lower extremity swelling.   Relevant past medical, surgical, family and social history reviewed and updated as indicated. Interim medical history since our last visit reviewed. Allergies and medications reviewed and updated.  Review of Systems  Eyes:  Negative for visual disturbance.  Respiratory:  Negative for cough, chest tightness and shortness of breath.   Cardiovascular:  Negative for chest pain, palpitations and leg swelling.  Neurological:  Negative for dizziness and headaches.   Per HPI unless specifically indicated above     Objective:    There were no vitals taken for this visit.  Wt Readings from Last 3 Encounters:  10/27/20 181 lb (82.1 kg)  01/07/20 182 lb 9.6 oz (82.8 kg)  10/13/19 177 lb (80.3 kg)    Physical Exam Vitals and nursing note reviewed.  HENT:     Head: Normocephalic.     Right Ear: Hearing normal.     Left Ear: Hearing normal.     Nose: Nose normal.  Eyes:     Pupils: Pupils are equal, round, and reactive to light.  Pulmonary:     Effort: Pulmonary effort is normal. No respiratory distress.  Neurological:     Mental Status: She is alert.  Psychiatric:        Mood and Affect: Mood normal.        Behavior: Behavior normal.        Thought Content: Thought content normal.        Judgment: Judgment normal.    Results for orders placed or performed in visit on 11/14/20  Cologuard  Result Value Ref Range   Cologuard Negative Negative       Assessment & Plan:   Problem List Items Addressed This Visit       Other   Cigarette smoker - Primary    Chronic. Will increase Wellbutrin from 100mg  to 200mg  daily to continue to help with cravings.  Recommend patient replace cigarette with a piece of gum or popsicle to help break the habit.  Patient can also pick a day in 1 week to to stop smoking all together.  Follow up in 3 months and see how patient is doing.        Follow up plan: Return in about 3 months (around 02/27/2021) for Smoking Cessation.  This visit was completed via MyChart due to the restrictions of the COVID-19 pandemic. All issues as above were discussed and addressed. Physical exam was done as above through visual confirmation on MyChart. If it was felt that the patient should be evaluated in the office, they were directed there. The patient verbally consented to this visit. Location of the patient: Home Location of the provider: Office Those involved with this call:  Provider: , NP CMA: 04/27/2021, CMA Front Desk/Registration: Larae Grooms This encounter was conducted via video.  I spent 20 dedicated to the care of this patient on the date of this encounter to include previsit  review of 30, face to face time with the patient, and post visit ordering of testing.

## 2020-11-28 ENCOUNTER — Encounter: Payer: Self-pay | Admitting: Nurse Practitioner

## 2020-11-28 NOTE — Assessment & Plan Note (Signed)
Chronic. Will increase Wellbutrin from 100mg  to 200mg  daily to continue to help with cravings.  Recommend patient replace cigarette with a piece of gum or popsicle to help break the habit.  Patient can also pick a day in 1 week to to stop smoking all together.  Follow up in 3 months and see how patient is doing.

## 2021-03-08 ENCOUNTER — Other Ambulatory Visit: Payer: Self-pay | Admitting: Family Medicine

## 2021-03-08 DIAGNOSIS — Z1231 Encounter for screening mammogram for malignant neoplasm of breast: Secondary | ICD-10-CM

## 2021-03-13 DIAGNOSIS — E669 Obesity, unspecified: Secondary | ICD-10-CM | POA: Diagnosis not present

## 2021-04-10 ENCOUNTER — Other Ambulatory Visit: Payer: Self-pay

## 2021-04-10 ENCOUNTER — Ambulatory Visit
Admission: RE | Admit: 2021-04-10 | Discharge: 2021-04-10 | Disposition: A | Discharge status: Home / Self Care | Payer: BC Managed Care – PPO | Source: Ambulatory Visit | Attending: Family Medicine | Admitting: Family Medicine

## 2021-04-10 DIAGNOSIS — Z1231 Encounter for screening mammogram for malignant neoplasm of breast: Secondary | ICD-10-CM | POA: Diagnosis not present

## 2021-04-13 ENCOUNTER — Encounter: Payer: Self-pay | Admitting: Nurse Practitioner

## 2021-04-13 ENCOUNTER — Other Ambulatory Visit: Payer: Self-pay | Admitting: Family Medicine

## 2021-04-13 DIAGNOSIS — R921 Mammographic calcification found on diagnostic imaging of breast: Secondary | ICD-10-CM

## 2021-04-13 DIAGNOSIS — R928 Other abnormal and inconclusive findings on diagnostic imaging of breast: Secondary | ICD-10-CM

## 2021-04-13 DIAGNOSIS — E669 Obesity, unspecified: Secondary | ICD-10-CM | POA: Diagnosis not present

## 2021-04-23 ENCOUNTER — Ambulatory Visit
Admission: RE | Admit: 2021-04-23 | Discharge: 2021-04-23 | Disposition: A | Discharge status: Home / Self Care | Payer: BC Managed Care – PPO | Source: Ambulatory Visit | Attending: Family Medicine | Admitting: Family Medicine

## 2021-04-23 ENCOUNTER — Other Ambulatory Visit: Payer: Self-pay

## 2021-04-23 DIAGNOSIS — R928 Other abnormal and inconclusive findings on diagnostic imaging of breast: Secondary | ICD-10-CM | POA: Diagnosis not present

## 2021-04-23 DIAGNOSIS — R922 Inconclusive mammogram: Secondary | ICD-10-CM | POA: Diagnosis not present

## 2021-04-23 DIAGNOSIS — R921 Mammographic calcification found on diagnostic imaging of breast: Secondary | ICD-10-CM | POA: Diagnosis not present

## 2021-04-24 ENCOUNTER — Other Ambulatory Visit: Payer: Self-pay | Admitting: Family Medicine

## 2021-04-24 ENCOUNTER — Other Ambulatory Visit: Payer: Self-pay | Admitting: Surgery

## 2021-04-24 DIAGNOSIS — R921 Mammographic calcification found on diagnostic imaging of breast: Secondary | ICD-10-CM

## 2021-04-24 DIAGNOSIS — R928 Other abnormal and inconclusive findings on diagnostic imaging of breast: Secondary | ICD-10-CM

## 2021-04-24 NOTE — Progress Notes (Signed)
Results were discussed with patient with the Radiologist.

## 2021-04-26 ENCOUNTER — Ambulatory Visit (INDEPENDENT_AMBULATORY_CARE_PROVIDER_SITE_OTHER): Payer: BC Managed Care – PPO | Admitting: Nurse Practitioner

## 2021-04-26 ENCOUNTER — Encounter: Payer: Self-pay | Admitting: Nurse Practitioner

## 2021-04-26 ENCOUNTER — Other Ambulatory Visit: Payer: Self-pay

## 2021-04-26 VITALS — BP 127/83 | HR 82 | Temp 98.5°F | Ht 66.0 in | Wt 189.6 lb

## 2021-04-26 DIAGNOSIS — Z1159 Encounter for screening for other viral diseases: Secondary | ICD-10-CM | POA: Diagnosis not present

## 2021-04-26 DIAGNOSIS — Z136 Encounter for screening for cardiovascular disorders: Secondary | ICD-10-CM

## 2021-04-26 DIAGNOSIS — F329 Major depressive disorder, single episode, unspecified: Secondary | ICD-10-CM

## 2021-04-26 DIAGNOSIS — R8290 Unspecified abnormal findings in urine: Secondary | ICD-10-CM | POA: Diagnosis not present

## 2021-04-26 DIAGNOSIS — G4700 Insomnia, unspecified: Secondary | ICD-10-CM

## 2021-04-26 DIAGNOSIS — Z0000 Encounter for general adult medical examination without abnormal findings: Secondary | ICD-10-CM | POA: Diagnosis not present

## 2021-04-26 DIAGNOSIS — F17210 Nicotine dependence, cigarettes, uncomplicated: Secondary | ICD-10-CM

## 2021-04-26 LAB — MICROSCOPIC EXAMINATION: WBC, UA: NONE SEEN /hpf (ref 0–5)

## 2021-04-26 LAB — URINALYSIS, ROUTINE W REFLEX MICROSCOPIC
Bilirubin, UA: NEGATIVE
Glucose, UA: NEGATIVE
Ketones, UA: NEGATIVE
Leukocytes,UA: NEGATIVE
Nitrite, UA: NEGATIVE
Protein,UA: NEGATIVE
Specific Gravity, UA: 1.01 (ref 1.005–1.030)
Urobilinogen, Ur: 0.2 mg/dL (ref 0.2–1.0)
pH, UA: 6 (ref 5.0–7.5)

## 2021-04-26 MED ORDER — CHANTIX STARTING MONTH PAK 0.5 MG X 11 & 1 MG X 42 PO TBPK: 0.5000 mg | ORAL_TABLET | Freq: Every day | ORAL | 0 refills | Status: DC

## 2021-04-26 MED ORDER — ESCITALOPRAM OXALATE 5 MG PO TABS
ORAL_TABLET | ORAL | 1 refills | Status: DC
Start: 2021-04-26 — End: 2021-10-16

## 2021-04-26 MED ORDER — TRAZODONE HCL 100 MG PO TABS: 100.0000 mg | ORAL_TABLET | Freq: Every day | ORAL | 1 refills | Status: DC

## 2021-04-26 MED ORDER — BUPROPION HCL ER (SR) 200 MG PO TB12: 200.0000 mg | ORAL_TABLET | Freq: Every day | ORAL | 1 refills | Status: DC

## 2021-04-26 NOTE — Assessment & Plan Note (Signed)
Chronic. Current everyday smoker. Ready to quit.  Has used chantix in the past and was able to quit.  Restarted smoking during the pandemic and times of stress.  Has tried Wellbutrin without success.  Will prescribe Chantix starting Pak. Side effects and benefits discussed with patient during visit.  Labs ordered today.  Follow up in 1 month for reevaluation. ?

## 2021-04-26 NOTE — Progress Notes (Signed)
BP 127/83   Pulse 82   Temp 98.5 F (36.9 C) (Oral)   Ht 5\' 6"  (1.676 m)   Wt 189 lb 9.6 oz (86 kg)   LMP 04/28/2018 Comment: not preg  SpO2 98%   BMI 30.60 kg/m    Subjective:    Patient ID: Megan Mendoza, female    DOB: [birth year], 51 y.o.   MRN: [id number]  HPI: MARGEAN LEONARDIS is a 51 y.o. female presenting on 04/26/2021 for comprehensive medical examination. Current medical complaints include:none  She currently lives with: Menopausal Symptoms: no  DEPRESSION Patient states she feels like she is doing well. She has a program through her work that is helping her with weight loss.  She has more energy.  Feels like the Wellbutrin and lexapro are working well for her.  Depression Screen done today and results listed below:  Depression screen [facility] 2/9 04/26/2021 10/27/2020 01/07/2020 10/13/2019 04/07/2019  Decreased Interest 0 0 0 1 1  Down, Depressed, Hopeless 0 0 0 1 0  PHQ - 2 Score 0 0 0 2 1  Altered sleeping 0 0 3 2 2   Tired, decreased energy 0 3 3 3 2   Change in appetite 0 0 0 1 0  Feeling bad or failure about yourself  0 0 0 1 0  Trouble concentrating 0 0 0 0 0  Moving slowly or fidgety/restless 0 0 0 0 0  Suicidal thoughts 0 0 0 0 0  PHQ-9 Score 0 3 6 9 5   Difficult doing work/chores Not difficult at all - - - -    The patient does not have a history of falls. I did complete a risk assessment for falls. A plan of care for falls was documented.   Past Medical History:  Past Medical History:  Diagnosis Date   Insomnia    Perimenopausal     Surgical History:  Past Surgical History:  Procedure Laterality Date   CESAREAN SECTION     2   TUBAL LIGATION      Medications:  No current outpatient medications on file prior to visit.   No current facility-administered medications on file prior to visit.    Allergies:  Allergies  Allergen Reactions   Aspirin Nausea And Vomiting    Social History:  Social History   Socioeconomic History   Marital  status: Married    Spouse name: Not on file   Number of children: Not on file   Years of education: Not on file   Highest education level: Not on file  Occupational History   Not on file  Tobacco Use   Smoking status: Every Day    Packs/day: 1.00    Types: Cigarettes   Smokeless tobacco: Never   Tobacco comments:    pt states she stopped smoking 4 days ago  Vaping Use   Vaping Use: Never used  Substance and Sexual Activity   Alcohol use: Yes    Comment: ocassionally   Drug use: Never   Sexual activity: Yes  Other Topics Concern   Not on file  Social History Narrative   Not on file   Social Determinants of Health   Financial Resource Strain: Not on file  Food Insecurity: Not on file  Transportation Needs: Not on file  Physical Activity: Not on file  Stress: Not on file  Social Connections: Not on file  Intimate Partner Violence: Not on file   Social History   Tobacco Use  Smoking Status Every Day  Packs/day: 1.00   Types: Cigarettes  Smokeless Tobacco Never  Tobacco Comments   pt states she stopped smoking 4 days ago   Social History   Substance and Sexual Activity  Alcohol Use Yes   Comment: ocassionally    Family History:  Family History  Problem Relation Age of Onset   Heart disease Mother    Lung cancer Father    Asthma Son    ADD / ADHD Son    Breast cancer Neg Hx     Past medical history, surgical history, medications, allergies, family history and social history reviewed with patient today and changes made to appropriate areas of the chart.   Review of Systems  Eyes:  Negative for blurred vision and double vision.  Respiratory:  Negative for shortness of breath.   Cardiovascular:  Negative for chest pain, palpitations and leg swelling.  Neurological:  Negative for dizziness and headaches.  Psychiatric/Behavioral:  Negative for depression. The patient is not nervous/anxious and does not have insomnia.   All other ROS negative except what is  listed above and in the HPI.      Objective:    BP 127/83   Pulse 82   Temp 98.5 F (36.9 C) (Oral)   Ht 5\' 6"  (1.676 m)   Wt 189 lb 9.6 oz (86 kg)   LMP 04/28/2018 Comment: not preg  SpO2 98%   BMI 30.60 kg/m   Wt Readings from Last 3 Encounters:  04/26/21 189 lb 9.6 oz (86 kg)  10/27/20 181 lb (82.1 kg)  01/07/20 182 lb 9.6 oz (82.8 kg)    Physical Exam Vitals and nursing note reviewed.  Constitutional:      General: She is awake. She is not in acute distress.    Appearance: She is well-developed and normal weight. She is not ill-appearing.  HENT:     Head: Normocephalic and atraumatic.     Right Ear: Hearing, tympanic membrane, ear canal and external ear normal. No drainage.     Left Ear: Hearing, tympanic membrane, ear canal and external ear normal. No drainage.     Nose: Nose normal.     Right Sinus: No maxillary sinus tenderness or frontal sinus tenderness.     Left Sinus: No maxillary sinus tenderness or frontal sinus tenderness.     Mouth/Throat:     Mouth: Mucous membranes are moist.     Pharynx: Oropharynx is clear. Uvula midline. No pharyngeal swelling, oropharyngeal exudate or posterior oropharyngeal erythema.  Eyes:     General: Lids are normal.        Right eye: No discharge.        Left eye: No discharge.     Extraocular Movements: Extraocular movements intact.     Conjunctiva/sclera: Conjunctivae normal.     Pupils: Pupils are equal, round, and reactive to light.     Visual Fields: Right eye visual fields normal and left eye visual fields normal.  Neck:     Thyroid: No thyromegaly.     Vascular: No carotid bruit.     Trachea: Trachea normal.  Cardiovascular:     Rate and Rhythm: Normal rate and regular rhythm.     Heart sounds: Normal heart sounds. No murmur heard.   No gallop.  Pulmonary:     Effort: Pulmonary effort is normal. No accessory muscle usage or respiratory distress.     Breath sounds: Normal breath sounds.  Chest:  Breasts:     Right: Normal.     Left:  Normal.  Abdominal:     General: Bowel sounds are normal.     Palpations: Abdomen is soft. There is no hepatomegaly or splenomegaly.     Tenderness: There is no abdominal tenderness.  Musculoskeletal:        General: Normal range of motion.     Cervical back: Normal range of motion and neck supple.     Right lower leg: No edema.     Left lower leg: No edema.  Lymphadenopathy:     Head:     Right side of head: No submental, submandibular, tonsillar, preauricular or posterior auricular adenopathy.     Left side of head: No submental, submandibular, tonsillar, preauricular or posterior auricular adenopathy.     Cervical: No cervical adenopathy.     Upper Body:     Right upper body: No supraclavicular, axillary or pectoral adenopathy.     Left upper body: No supraclavicular, axillary or pectoral adenopathy.  Skin:    General: Skin is warm and dry.     Capillary Refill: Capillary refill takes less than 2 seconds.     Findings: No rash.  Neurological:     Mental Status: She is alert and oriented to person, place, and time.     Gait: Gait is intact.     Deep Tendon Reflexes: Reflexes are normal and symmetric.     Reflex Scores:      Brachioradialis reflexes are 2+ on the right side and 2+ on the left side.      Patellar reflexes are 2+ on the right side and 2+ on the left side. Psychiatric:        Attention and Perception: Attention normal.        Mood and Affect: Mood normal.        Speech: Speech normal.        Behavior: Behavior normal. Behavior is cooperative.        Thought Content: Thought content normal.        Judgment: Judgment normal.    Results for orders placed or performed in visit on 11/14/20  Cologuard  Result Value Ref Range   Cologuard Negative Negative      Assessment & Plan:   Problem List Items Addressed This Visit       Other   Depression    Chronic.  Controlled.  Continue with current medication regimen on Wellbutrin 200mg   daily and Lexapro 5mg  daily.  Refills sent today.  Labs ordered today.  Return to clinic in 6 months for reevaluation.  Call sooner if concerns arise.        Relevant Medications   buPROPion (WELLBUTRIN SR) 200 MG 12 hr tablet   escitalopram (LEXAPRO) 5 MG tablet   traZODone (DESYREL) 100 MG tablet   Insomnia    Chronic.  Controlled.  Continue with current medication regimen on Trazodone daily.  Refills sent today.  Labs ordered today.  Return to clinic in 6 months for reevaluation.  Call sooner if concerns arise.        Cigarette smoker    Chronic. Current everyday smoker. Ready to quit.  Has used chantix in the past and was able to quit.  Restarted smoking during the pandemic and times of stress.  Has tried Wellbutrin without success.  Will prescribe Chantix starting Pak. Side effects and benefits discussed with patient during visit.  Labs ordered today.  Follow up in 1 month for reevaluation.      Other Visit Diagnoses  Annual physical exam    -  Primary   Health maintenance reviewed during visit today. Labs ordered. Mammogram up to date.  Cologuard up to date. Declined vaccines.   Relevant Orders   TSH   PSA   Lipid panel   CBC with Differential/Platelet   Comprehensive metabolic panel   Urinalysis, Routine w reflex microscopic   Encounter for hepatitis C screening test for low risk patient       Relevant Orders   Hepatitis C Antibody   Screening for ischemic heart disease       Relevant Orders   Lipid panel        Follow up plan: Return in about 1 month (around 05/27/2021) for Smoking Cessation (virtual okay).   LABORATORY TESTING:  - Pap smear: pap done  IMMUNIZATIONS:   - Tdap: Tetanus vaccination status reviewed: last tetanus booster within 10 years. - Influenza: Up to date - Pneumovax: Refused - Prevnar: Not applicable - COVID: Up to date - HPV: Not applicable - Shingrix vaccine: Refused  SCREENING: -Mammogram: Up to date  - Colonoscopy: Up to date   - Bone Density: Not applicable  -Hearing Test: Not applicable  -Spirometry: Not applicable   PATIENT COUNSELING:   Advised to take 1 mg of folate supplement per day if capable of pregnancy.   Sexuality: Discussed sexually transmitted diseases, partner selection, use of condoms, avoidance of unintended pregnancy  and contraceptive alternatives.   Advised to avoid cigarette smoking.  I discussed with the patient that most people either abstain from alcohol or drink within safe limits (<=14/week and <=4 drinks/occasion for males, <=7/weeks and <= 3 drinks/occasion for females) and that the risk for alcohol disorders and other health effects rises proportionally with the number of drinks per week and how often a drinker exceeds daily limits.  Discussed cessation/primary prevention of drug use and availability of treatment for abuse.   Diet: Encouraged to adjust caloric intake to maintain  or achieve ideal body weight, to reduce intake of dietary saturated fat and total fat, to limit sodium intake by avoiding high sodium foods and not adding table salt, and to maintain adequate dietary potassium and calcium preferably from fresh fruits, vegetables, and low-fat dairy products.    stressed the importance of regular exercise  Injury prevention: Discussed safety belts, safety helmets, smoke detector, smoking near bedding or upholstery.   Dental health: Discussed importance of regular tooth brushing, flossing, and dental visits.    NEXT PREVENTATIVE PHYSICAL DUE IN 1 YEAR. Return in about 1 month (around 05/27/2021) for Smoking Cessation (virtual okay).

## 2021-04-26 NOTE — Assessment & Plan Note (Signed)
Chronic.  Controlled.  Continue with current medication regimen on Wellbutrin 200mg  daily and Lexapro 5mg  daily.  Refills sent today.  Labs ordered today.  Return to clinic in 6 months for reevaluation.  Call sooner if concerns arise.  ? ?

## 2021-04-26 NOTE — Assessment & Plan Note (Signed)
Chronic.  Controlled.  Continue with current medication regimen on Trazodone daily.  Refills sent today.  Labs ordered today.  Return to clinic in 6 months for reevaluation.  Call sooner if concerns arise.  ? ?

## 2021-04-27 ENCOUNTER — Encounter: Payer: Self-pay | Admitting: Nurse Practitioner

## 2021-04-27 LAB — CBC WITH DIFFERENTIAL/PLATELET
Basophils Absolute: 0 10*3/uL (ref 0.0–0.2)
Basos: 0 %
EOS (ABSOLUTE): 0.2 10*3/uL (ref 0.0–0.4)
Eos: 2 %
Hematocrit: 45.8 % (ref 34.0–46.6)
Hemoglobin: 15.7 g/dL (ref 11.1–15.9)
Immature Grans (Abs): 0.1 10*3/uL (ref 0.0–0.1)
Immature Granulocytes: 1 %
Lymphocytes Absolute: 1.5 10*3/uL (ref 0.7–3.1)
Lymphs: 15 %
MCH: 31.7 pg (ref 26.6–33.0)
MCHC: 34.3 g/dL (ref 31.5–35.7)
MCV: 93 fL (ref 79–97)
Monocytes Absolute: 0.5 10*3/uL (ref 0.1–0.9)
Monocytes: 5 %
Neutrophils Absolute: 7.7 10*3/uL — ABNORMAL HIGH (ref 1.4–7.0)
Neutrophils: 77 %
Platelets: 220 10*3/uL (ref 150–450)
RBC: 4.95 x10E6/uL (ref 3.77–5.28)
RDW: 12 % (ref 11.7–15.4)
WBC: 10 10*3/uL (ref 3.4–10.8)

## 2021-04-27 LAB — COMPREHENSIVE METABOLIC PANEL
ALT: 35 IU/L — ABNORMAL HIGH (ref 0–32)
AST: 30 IU/L (ref 0–40)
Albumin/Globulin Ratio: 1.6 (ref 1.2–2.2)
Albumin: 4.4 g/dL (ref 3.8–4.9)
Alkaline Phosphatase: 86 IU/L (ref 44–121)
BUN/Creatinine Ratio: 21 (ref 9–23)
BUN: 15 mg/dL (ref 6–24)
Bilirubin Total: 0.3 mg/dL (ref 0.0–1.2)
CO2: 23 mmol/L (ref 20–29)
Calcium: 9.6 mg/dL (ref 8.7–10.2)
Chloride: 99 mmol/L (ref 96–106)
Creatinine, Ser: 0.7 mg/dL (ref 0.57–1.00)
Globulin, Total: 2.7 g/dL (ref 1.5–4.5)
Glucose: 81 mg/dL (ref 70–99)
Potassium: 4.4 mmol/L (ref 3.5–5.2)
Sodium: 137 mmol/L (ref 134–144)
Total Protein: 7.1 g/dL (ref 6.0–8.5)
eGFR: 105 mL/min/{1.73_m2} (ref >59)

## 2021-04-27 LAB — LIPID PANEL
Chol/HDL Ratio: 3.3 ratio (ref 0.0–4.4)
Cholesterol, Total: 204 mg/dL — ABNORMAL HIGH (ref 100–199)
HDL: 62 mg/dL (ref >39)
LDL Chol Calc (NIH): 119 mg/dL — ABNORMAL HIGH (ref 0–99)
Triglycerides: 132 mg/dL (ref 0–149)
VLDL Cholesterol Cal: 23 mg/dL (ref 5–40)

## 2021-04-27 LAB — PSA: Prostate Specific Ag, Serum: <0.1 ng/mL

## 2021-04-27 LAB — TSH: TSH: 2.13 u[IU]/mL (ref 0.450–4.500)

## 2021-04-27 LAB — HEPATITIS C ANTIBODY: Hep C Virus Ab: NONREACTIVE

## 2021-04-27 NOTE — Progress Notes (Signed)
Hi Megan Mendoza.  It was great to see you yesterday.  Your lab work looks good.  Your cholesterol is elevated.  I recommend a low fat diet and exercise. Your Cardiac risk score is still in the low risk category.  Continue with your current regimen.  Follow up as discussed.  ? ?The 10-year ASCVD risk score (Arnett DK, et al., 2019) is: 3.3% ?  Values used to calculate the score: ?    Age: 51 years ?    Sex: Female ?    Is Non-Hispanic African American: No ?    Diabetic: No ?    Tobacco smoker: Yes ?    Systolic Blood Pressure: [id number] ?    Is BP treated: No ?    HDL Cholesterol: 62 mg/dL ?    Total Cholesterol: 204 mg/dL ?

## 2021-04-28 LAB — URINE CULTURE

## 2021-04-30 NOTE — Progress Notes (Signed)
Megan Mendoza.  There was no bacterial growth on your urine culture.  No need for further treatment.  Let me know if you have any questions.

## 2021-05-03 DIAGNOSIS — M79642 Pain in left hand: Secondary | ICD-10-CM | POA: Diagnosis not present

## 2021-05-03 DIAGNOSIS — S60021A Contusion of right index finger without damage to nail, initial encounter: Secondary | ICD-10-CM | POA: Diagnosis not present

## 2021-05-08 ENCOUNTER — Other Ambulatory Visit: Payer: Self-pay | Admitting: Nurse Practitioner

## 2021-05-09 ENCOUNTER — Ambulatory Visit
Admission: RE | Admit: 2021-05-09 | Discharge: 2021-05-09 | Disposition: A | Discharge status: Home / Self Care | Payer: BC Managed Care – PPO | Source: Ambulatory Visit | Attending: Family Medicine | Admitting: Family Medicine

## 2021-05-09 ENCOUNTER — Other Ambulatory Visit: Payer: Self-pay

## 2021-05-09 DIAGNOSIS — R921 Mammographic calcification found on diagnostic imaging of breast: Secondary | ICD-10-CM

## 2021-05-09 DIAGNOSIS — N6089 Other benign mammary dysplasias of unspecified breast: Secondary | ICD-10-CM | POA: Diagnosis not present

## 2021-05-09 DIAGNOSIS — R928 Other abnormal and inconclusive findings on diagnostic imaging of breast: Secondary | ICD-10-CM | POA: Diagnosis not present

## 2021-05-09 HISTORY — PX: BREAST BIOPSY: SHX20

## 2021-05-09 NOTE — Telephone Encounter (Signed)
Called patient to verify pharmacy. Patient reports she has already picked up Rx from [facility] on 04/26/21.  ?Requested Prescriptions  ?Refused Prescriptions Disp Refills  ?? escitalopram (LEXAPRO) 5 MG tablet [Pharmacy Med Name: ESCITALOPRAM 5MG  TABLETS] 90 tablet 1  ?  Sig: TAKE 1 TABLET BY MOUTH DAILY  ?  ? Psychiatry:  Antidepressants - SSRI Passed - 05/08/2021  3:33 AM  ?  ?  Passed - Completed PHQ-2 or PHQ-9 in the last 360 days  ?  ?  Passed - Valid encounter within last 6 months  ?  Recent Outpatient Visits   ?      ? 1 week ago Annual physical exam  ? [place], NP  ? 5 months ago Cigarette smoker  ? [place], NP  ? 6 months ago Need for influenza vaccination  ? [place], NP  ? 1 year ago Reactive depression  ? [place], DO  ? 1 year ago Cigarette smoker  ? [place], [place], [place]  ?  ?  ?Future Appointments   ?        ? In 3 weeks Jon Billings, NP [facility]  ? In 5 months Jon Billings, NP [facility], [facility]  ?  ? ?  ?  ?  ? ?

## 2021-05-09 NOTE — Telephone Encounter (Signed)
Called patient and patient reports medication picked up at [facility] 04/26/21 ?

## 2021-05-09 NOTE — Telephone Encounter (Signed)
Please verify pharmacy. 04/26/21 Rx sent to [facility]. [facility] requesting refill  ?

## 2021-05-10 LAB — SURGICAL PATHOLOGY

## 2021-05-11 DIAGNOSIS — E669 Obesity, unspecified: Secondary | ICD-10-CM | POA: Diagnosis not present

## 2021-05-11 NOTE — Progress Notes (Signed)
Received Radiologist recommendation from Collene Mares, [facility], for surgical consult.  Scheduled patient with Dr. Claudine Mouton 05/15/21 at 9:00.   ?

## 2021-05-14 ENCOUNTER — Other Ambulatory Visit: Payer: Self-pay | Admitting: Nurse Practitioner

## 2021-05-15 ENCOUNTER — Other Ambulatory Visit: Payer: Self-pay

## 2021-05-15 ENCOUNTER — Other Ambulatory Visit: Payer: Self-pay | Admitting: Surgery

## 2021-05-15 ENCOUNTER — Encounter: Payer: Self-pay | Admitting: Surgery

## 2021-05-15 ENCOUNTER — Ambulatory Visit: Payer: BC Managed Care – PPO | Admitting: Surgery

## 2021-05-15 VITALS — BP 151/79 | HR 85 | Temp 98.3°F | Ht 65.0 in | Wt 189.8 lb

## 2021-05-15 DIAGNOSIS — N6099 Unspecified benign mammary dysplasia of unspecified breast: Secondary | ICD-10-CM

## 2021-05-15 DIAGNOSIS — R928 Other abnormal and inconclusive findings on diagnostic imaging of breast: Secondary | ICD-10-CM

## 2021-05-15 DIAGNOSIS — N6091 Unspecified benign mammary dysplasia of right breast: Secondary | ICD-10-CM

## 2021-05-15 NOTE — Progress Notes (Signed)
Patient ID: Megan Mendoza, female   DOB: May 25, 1970, 51 y.o.   MRN: [id number] ? ?Chief Complaint: Atypical ductal hyperplasia, right breast ? ?History of Present Illness ?Megan Mendoza is a 51 y.o. female with a recent screening mammogram, with diagnostic mammogram confirming suspicion of a 4 mm area of microcalcifications upper outer quadrant right breast.  Stereotactic core biopsy obtained confirms atypical ductal hyperplasia without any evidence of carcinoma in situ or invasive cancer.  She presents here to be evaluated for excisional biopsy ?She reports utilization of birth control.  She is postmenopausal.  She denies any family history of breast cancer.  She began menstruating at the age of 51.  She has been pregnant 4 times delivering 3 children, with 1 AB.  Her first pregnancy occurred at the age of 51, current children are aged 52, 4719 and 4911. ?She denies any prior breast issues, never breast-feeding, no breast pain or nipple discharge, no skin changes nor palpable lumps. ? ?Past Medical History ?Past Medical History:  ?Diagnosis Date  ? Insomnia   ? Perimenopausal   ?  ? ? ?Past Surgical History:  ?Procedure Laterality Date  ? BREAST BIOPSY Right 05/09/2021  ? right stereo bx ribbon clip path pending  ? CESAREAN SECTION    ? 2  ? TUBAL LIGATION    ? ? ?Allergies  ?Allergen Reactions  ? Aspirin Nausea And Vomiting  ? ? ?Current Outpatient Medications  ?Medication Sig Dispense Refill  ? escitalopram (LEXAPRO) 5 MG tablet Take 1 tab by mouth daily. 90 tablet 1  ? traZODone (DESYREL) 100 MG tablet Take 1 tablet (100 mg total) by mouth at bedtime. TAKE 1 TABLET BY MOUTH AT BEDTIME AS NEEDED FOR SLEEP 90 tablet 1  ? Varenicline Tartrate, Starter, (CHANTIX STARTING MONTH PAK) 0.5 MG X 11 & 1 MG X 42 TBPK Take 0.5 mg by mouth daily. Take one 0.5 mg tablet by mouth once daily for 3 days, then increase to one 0.5 mg tablet twice daily for 4 days, then increase to one 1 mg tablet twice daily. 60 each 0  ? ?No  current facility-administered medications for this visit.  ? ? ?Family History ?Family History  ?Problem Relation Age of Onset  ? Heart disease Mother   ? Lung cancer Father   ? Asthma Son   ? ADD / ADHD Son   ? Breast cancer Neg Hx   ?  ? ? ?Social History ?Social History  ? ?Tobacco Use  ? Smoking status: Every Day  ?  Packs/day: 1.00  ?  Types: Cigarettes  ? Smokeless tobacco: Never  ? Tobacco comments:  ?  pt states she stopped smoking 4 days ago  ?Vaping Use  ? Vaping Use: Never used  ?Substance Use Topics  ? Alcohol use: Yes  ?  Comment: ocassionally  ? Drug use: Never  ?  ?  ? ? ?Review of Systems  ?Constitutional: Negative.   ?HENT: Negative.    ?Eyes: Negative.   ?Respiratory: Negative.    ?Cardiovascular: Negative.   ?Gastrointestinal: Negative.   ?Genitourinary: Negative.   ?Skin: Negative.   ?Neurological: Negative.   ?Psychiatric/Behavioral: Negative.    ?  ? ?Physical Exam ?Blood pressure (!) 151/79, pulse 85, temperature 98.3 ?F (36.8 ?C), temperature source Oral, height 5\' 5"  (1.651 m), weight 189 lb 12.8 oz (86.1 kg), last menstrual period 04/28/2018. ?Last Weight  Most recent update: 05/15/2021  9:10 AM  ? ? Weight  ?86.1 kg (189 lb 12.8 oz)  ?      ? ?  ? ? ?  CONSTITUTIONAL: Well developed, and nourished, appropriately responsive and aware without distress.   ?EYES: Sclera non-icteric.   ?EARS, NOSE, MOUTH AND THROAT:  The oropharynx is clear. Oral mucosa is pink and moist.    Hearing is intact to voice.  ?NECK: Trachea is midline, and there is no jugular venous distension.  ?LYMPH NODES:  Lymph nodes in the neck are not enlarged. ?RESPIRATORY:  Lungs are clear, and breath sounds are equal bilaterally. Normal respiratory effort without pathologic use of accessory muscles. ?CARDIOVASCULAR: Heart is regular in rate and rhythm. ?GI: The abdomen is soft, nontender, and nondistended.  ?GU: Essentially deferred due to bruising. ?MUSCULOSKELETAL:  Symmetrical muscle tone appreciated in all four  extremities.    ?SKIN: Skin turgor is normal. No pathologic skin lesions appreciated.  ?NEUROLOGIC:  Motor and sensation appear grossly normal.  Cranial nerves are grossly without defect. ?PSYCH:  Alert and oriented to person, place and time. Affect is appropriate for situation. ? ?Data Reviewed ?I have personally reviewed what is currently available of the patient's imaging, recent labs and medical records.   ?Labs:  ? ?  Latest Ref Rng & Units 04/26/2021  ?  8:32 AM 04/07/2019  ?  3:52 PM 02/08/2013  ?  2:19 PM  ?CBC  ?WBC 3.4 - 10.8 x10E3/uL 10.0   7.4   8.2    ?Hemoglobin 11.1 - 15.9 g/dL 48.0   16.5   53.7    ?Hematocrit 34.0 - 46.6 % 45.8   46.7   44.5    ?Platelets 150 - 450 x10E3/uL 220   217   203    ? ? ?  Latest Ref Rng & Units 04/26/2021  ?  8:32 AM 04/07/2019  ?  3:52 PM 02/08/2013  ?  2:19 PM  ?CMP  ?Glucose 70 - 99 mg/dL 81   80   90    ?BUN 6 - 24 mg/dL 15   17   12     ?Creatinine 0.57 - 1.00 mg/dL   4.82   7.07    ?Sodium 134 - 144 mmol/L 137   140   137    ?Potassium 3.5 - 5.2 mmol/L 4.4   4.5   3.7    ?Chloride 96 - 106 mmol/L 99   101   107    ?CO2 20 - 29 mmol/L 23   23   24     ?Calcium 8.7 - 10.2 mg/dL 9.6   8.67   9.6    ?Total Protein 6.0 - 8.5 g/dL 7.1   7.2   8.2    ?Total Bilirubin 0.0 - 1.2 mg/dL 0.3   0.3   0.3    ?Alkaline Phos 44 - 121 IU/L 86   93   96    ?AST 0 - 40 IU/L 30   20   19     ?ALT 0 - 32 IU/L 35   27   36    ? ?SURGICAL PATHOLOGY  ?CASE: [id number]  ?PATIENT: Megan Mendoza  ?Surgical Pathology Report  ? ?Specimen Submitted:  ?A. Breast, right UOQ  ? ?Clinical History: CALCS.  R/O DCIS. Ribbon-shaped clip placed following  ?stereotactic biopsy of RIGHT breast, upper outer quadrant.  ? ?DIAGNOSIS:  ?A. BREAST, RIGHT UPPER OUTER QUADRANT; STEREOTACTIC CORE NEEDLE BIOPSY:  ?- ATYPICAL DUCTAL HYPERPLASIA, INVOLVING COLUMNAR CELL LESION, WITH  ?ASSOCIATED COARSE CALCIFICATIONS.  ?- BACKGROUND MAMMARY PARENCHYMA DISPLAYING FIBROCYSTIC AND APOCRINE  ?CHANGES, AND FOCAL  PSEUDOANGIOMATOUS STROMAL HYPERPLASIA (PASH).  ?- NEGATIVE FOR CARCINOMA  IN SITU AND INVASIVE MAMMARY CARCINOMA.  ? ?Comment:  ?ADH involves 4 of 5 sampled tissue blocks, measuring up to 4 mm in  ?greatest linear extent.  ? ? ?Imaging: ?Radiology review:  ?CLINICAL DATA:  51 year old female for further evaluation of RIGHT ?breast calcifications identified on screening mammogram. ?  ?EXAM: ?DIGITAL DIAGNOSTIC UNILATERAL RIGHT MAMMOGRAM WITH TOMOSYNTHESIS AND ?CAD ?  ?TECHNIQUE: ?Right digital diagnostic mammography and breast tomosynthesis was ?performed. The images were evaluated with computer-aided detection. ?  ?COMPARISON:  Previous exam(s). ?  ?ACR Breast Density Category c: The breast tissue is heterogeneously ?dense, which may obscure small masses. ?  ?FINDINGS: ?Full field and magnification views of the RIGHT breast demonstrate a ?0.4 cm group of slightly heterogeneous and amorphous calcifications ?within the UPPER-OUTER RIGHT breast, middle to posterior depth. ?  ?IMPRESSION: ?Indeterminate 0.4 cm group of UPPER-OUTER RIGHT breast ?calcifications. Tissue sampling is recommended. ?  ?RECOMMENDATION: ?3D/stereotactic guided RIGHT breast biopsy, which will be scheduled. ?  ?I have discussed the findings and recommendations with the patient. ?If applicable, a reminder letter will be sent to the patient ?regarding the next appointment. ?  ?BI-RADS CATEGORY  4: Suspicious. ?  ?  ?Electronically Signed ?  By: Harmon Pier M.D. ?  On: 04/23/2021 13:21 ?  ?CLINICAL DATA:  Screening. ?  ?EXAM: ?DIGITAL SCREENING BILATERAL MAMMOGRAM WITH TOMOSYNTHESIS AND CAD ?  ?TECHNIQUE: ?Bilateral screening digital craniocaudal and mediolateral oblique ?mammograms were obtained. Bilateral screening digital breast ?tomosynthesis was performed. The images were evaluated with ?computer-aided detection. ?  ?COMPARISON:  Previous exam(s). ?  ?ACR Breast Density Category c: The breast tissue is heterogeneously ?dense, which may obscure  small masses. ?  ?FINDINGS: ?In the right breast, calcifications warrant further evaluation with ?magnified views. In the left breast, no findings suspicious for ?malignancy. ?  ?IMPRESSION: ?Further evaluation is suggest

## 2021-05-15 NOTE — Patient Instructions (Addendum)
Our surgery scheduler will call you within 24-48 hours to schedule your surgery. Please have the blue surgery sheet available when speaking with her.  

## 2021-05-16 NOTE — Telephone Encounter (Signed)
Call to patient- patient is presently using [facility] and does not need this Rx- she has already picked up- will refuse. ?Requested Prescriptions  ?Pending Prescriptions Disp Refills  ?? traZODone (DESYREL) 100 MG tablet [Pharmacy Med Name: TRAZODONE 100MG  TABLETS] 90 tablet 1  ?  Sig: TAKE 1 TABLET BY MOUTH AT BEDTIME AS NEEDED FOR SLEEP  ?  ? Psychiatry: Antidepressants - Serotonin Modulator Passed - 05/14/2021 12:44 PM  ?  ?  Passed - Completed PHQ-2 or PHQ-9 in the last 360 days  ?  ?  Passed - Valid encounter within last 6 months  ?  Recent Outpatient Visits   ?      ? 2 weeks ago Annual physical exam  ? [facility], NP  ? 5 months ago Cigarette smoker  ? [facility], NP  ? 6 months ago Need for influenza vaccination  ? [facility], NP  ? 1 year ago Reactive depression  ? [facility] [place], Megan P, DO  ? 1 year ago Cigarette smoker  ? [facility] [place], [place], [place]  ?  ?  ?Future Appointments   ?        ? In 2 weeks [place], NP [facility]  ? In 5 months [facility], NP [facility]  ?  ? ?  ?  ?  ? ?

## 2021-05-17 ENCOUNTER — Telehealth: Payer: Self-pay | Admitting: Surgery

## 2021-05-17 NOTE — Telephone Encounter (Signed)
Patient has been advised of Pre-Admission date/time, COVID Testing date and Surgery date. ? ?Surgery Date: 06/15/21 ?Preadmission Testing Date: 06/08/21 (phone 1p-5p) ?Covid Testing Date: Not needed.    ? ?Patient has been made aware to call [phone], between 1-3:00pm the day before surgery, to find out what time to arrive for surgery.   ? ?Patient also reminded of RF tag placement with [facility] on 05/24/21 ?

## 2021-05-22 MED ORDER — VARENICLINE TARTRATE 1 MG PO TABS: 1.0000 mg | ORAL_TABLET | Freq: Two times a day (BID) | ORAL | 0 refills | Status: DC

## 2021-05-24 ENCOUNTER — Ambulatory Visit
Admission: RE | Admit: 2021-05-24 | Discharge: 2021-05-24 | Disposition: A | Discharge status: Home / Self Care | Payer: BC Managed Care – PPO | Source: Ambulatory Visit | Attending: Surgery | Admitting: Surgery

## 2021-05-24 DIAGNOSIS — N6099 Unspecified benign mammary dysplasia of unspecified breast: Secondary | ICD-10-CM | POA: Insufficient documentation

## 2021-05-24 DIAGNOSIS — R928 Other abnormal and inconclusive findings on diagnostic imaging of breast: Secondary | ICD-10-CM | POA: Diagnosis not present

## 2021-05-24 HISTORY — PX: BREAST LUMPECTOMY WITH RADIOFREQUENCY TAG IDENTIFICATION: SHX6884

## 2021-06-03 NOTE — Progress Notes (Signed)
? ?LMP 04/28/2018 Comment: not preg  ? ?Subjective:  ? ? Patient ID: Megan Mendoza, female    DOB: October 15, 1970, 51 y.o.   MRN: [id number] ? ?HPI: ?Megan Mendoza is a 51 y.o. female ? ?Chief Complaint  ?Patient presents with  ? Nicotine Dependence  ? ?SMOKING CESSATION ?Smoking Status: on day 8 of not smoking ?Smoking Amount: no ?Would like to continue with the chantix to help with continued smoking cessation. ? ? Denies HA, CP, SOB, dizziness, palpitations, visual changes, and lower extremity swelling. ? ? ?Relevant past medical, surgical, family and social history reviewed and updated as indicated. Interim medical history since our last visit reviewed. ?Allergies and medications reviewed and updated. ? ?Review of Systems  ?Eyes:  Negative for visual disturbance.  ?Respiratory:  Negative for cough, chest tightness and shortness of breath.   ?Cardiovascular:  Negative for chest pain, palpitations and leg swelling.  ?Neurological:  Negative for dizziness and headaches.  ? ?Per HPI unless specifically indicated above ? ?   ?Objective:  ?  ?LMP 04/28/2018 Comment: not preg  ?Wt Readings from Last 3 Encounters:  ?05/15/21 189 lb 12.8 oz (86.1 kg)  ?04/26/21 189 lb 9.6 oz (86 kg)  ?10/27/20 181 lb (82.1 kg)  ?  ?Physical Exam ?Vitals and nursing note reviewed.  ?HENT:  ?   Head: Normocephalic.  ?   Right Ear: Hearing normal.  ?   Left Ear: Hearing normal.  ?   Nose: Nose normal.  ?Eyes:  ?   Pupils: Pupils are equal, round, and reactive to light.  ?Pulmonary:  ?   Effort: Pulmonary effort is normal. No respiratory distress.  ?Neurological:  ?   Mental Status: She is alert.  ?Psychiatric:     ?   Mood and Affect: Mood normal.     ?   Behavior: Behavior normal.     ?   Thought Content: Thought content normal.     ?   Judgment: Judgment normal.  ? ? ?Results for orders placed or performed during the hospital encounter of 05/09/21  ?Surgical pathology  ?Result Value Ref Range  ? SURGICAL PATHOLOGY    ?  SURGICAL  PATHOLOGY ?CASE: [id number] ?PATIENT: Megan Mendoza ?Surgical Pathology Report ? ? ? ? ?Specimen Submitted: ?A. Breast, right UOQ ? ?Clinical History: CALCS.  R/O DCIS. Ribbon-shaped clip placed following ?stereotactic biopsy of RIGHT breast, upper outer quadrant. ? ? ? ?DIAGNOSIS: ?A. BREAST, RIGHT UPPER OUTER QUADRANT; STEREOTACTIC CORE NEEDLE BIOPSY: ?- ATYPICAL DUCTAL HYPERPLASIA, INVOLVING COLUMNAR CELL LESION, WITH ?ASSOCIATED COARSE CALCIFICATIONS. 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 ?- NEGATIVE FOR CARCINOMA IN SITU AND INVASIVE MAMMARY CARCINOMA. ? ?Comment: ?ADH involves 4 of 5 sampled tissue blocks, measuring up to 4 mm in ?greatest linear extent. ? ?GROSS DESCRIPTION: ?A. Labeled: Right breast calcs stereo biopsy upper outer quadrant ?Received: in a formalin-filled [place] collection device ?Specimen radiograph image(s) available for review ?Time/Date in fixative : Collected at 9:18 AM on 05/09/2021 and placed in ?formalin at 9:19 AM on 05/09/2021 ?Cold ischemic time: Approximately 1 minute ?Total fixation time: Approximately 8 hours ?Core pieces: Multiple ?Measurement: 1.0 x 1.0 x 0.6 cm ?Description / comments: Received are yellow fibrofatty fragments of ?tissue.  A diagram is provided on the specimen container lid and ?sections A, B, and C are checked ?Inked: Black ?Entirely submitted in cassette(s): ? ?1- section A ?2- section B ?3- section C ?4-sections D and E ?5-sections F  and G ? ?CM 05/09/2021 ? ?Final Diagnosis performed by Allena Napoleon, MD.   Electronically signed ?05/10/2021 1:26:13PM ?The electronic signature indicates that the named Attending Pathologist ?has evaluated the specimen ?Technical component performed at [facility], [address] [place], ?[place] [address] Lab: [id number] Dir: Rush Farmer, MD, MMM ? Professional component performed at [facility], [facility],  [place], [place], [place] [address] Lab : [phone] ?Dir: Kathi Simpers, MD ?  ? ?   ?Assessment & Plan:  ? ?Problem List Items Addressed This Visit   ? ?  ? Other  ? Cigarette smoker - Primary  ?  On day 8 of not smoking. Chantix is working well for her.  Will continue with current dose for 2 more months.  Then will work to wean down dose of Chantix.  Follow up in 2 months for reevaluation. ? ?  ?  ?  ? ?Follow up plan: ?Return in about 2 months (around 08/04/2021) for Smoking Cessation. ? ?This visit was completed via MyChart due to the restrictions of the COVID-19 pandemic. All issues as above were discussed and addressed. Physical exam was done as above through visual confirmation on MyChart. If it was felt that the patient should be evaluated in the office, they were directed there. The patient verbally consented to this visit. ?Location of the patient: Home ?Location of the provider: Office ?Those involved with this call:  ?Provider: Jon Billings, NP ?CMA: Duwaine Maxin ?Front Desk/Registration: Lynnell Catalan ?This encounter was conducted via video.  I spent 20 dedicated to the care of this patient on the date of this encounter to include previsit review of medication, questions answered, and plan moving forward, face to face time with the patient, and post visit ordering of testing.  ? ? ? ? ? ?

## 2021-06-04 ENCOUNTER — Encounter: Payer: Self-pay | Admitting: Nurse Practitioner

## 2021-06-04 ENCOUNTER — Telehealth (INDEPENDENT_AMBULATORY_CARE_PROVIDER_SITE_OTHER): Payer: BC Managed Care – PPO | Admitting: Nurse Practitioner

## 2021-06-04 DIAGNOSIS — F17210 Nicotine dependence, cigarettes, uncomplicated: Secondary | ICD-10-CM

## 2021-06-04 MED ORDER — VARENICLINE TARTRATE 1 MG PO TABS: 1.0000 mg | ORAL_TABLET | Freq: Two times a day (BID) | ORAL | 1 refills | Status: DC

## 2021-06-04 NOTE — Progress Notes (Signed)
Patient scheduled.

## 2021-06-04 NOTE — Assessment & Plan Note (Signed)
On day 8 of not smoking. Chantix is working well for her.  Will continue with current dose for 2 more months.  Then will work to wean down dose of Chantix.  Follow up in 2 months for reevaluation. ?

## 2021-06-07 ENCOUNTER — Other Ambulatory Visit: Payer: Self-pay | Admitting: Nurse Practitioner

## 2021-06-08 ENCOUNTER — Ambulatory Visit: Payer: Self-pay | Admitting: Surgery

## 2021-06-08 ENCOUNTER — Other Ambulatory Visit
Admission: RE | Admit: 2021-06-08 | Discharge: 2021-06-08 | Disposition: A | Discharge status: Home / Self Care | Payer: BC Managed Care – PPO | Source: Ambulatory Visit | Attending: Surgery | Admitting: Surgery

## 2021-06-08 ENCOUNTER — Other Ambulatory Visit: Payer: Self-pay

## 2021-06-08 DIAGNOSIS — N6091 Unspecified benign mammary dysplasia of right breast: Secondary | ICD-10-CM

## 2021-06-08 HISTORY — DX: Other specified postprocedural states: Z98.890

## 2021-06-08 HISTORY — DX: Other specified postprocedural states: R11.2

## 2021-06-08 HISTORY — DX: Other complications of anesthesia, initial encounter: T88.59XA

## 2021-06-08 HISTORY — DX: Nausea with vomiting, unspecified: R11.2

## 2021-06-08 NOTE — Patient Instructions (Addendum)
Your procedure is scheduled on: 06/15/21 - Friday ?Report to the Registration Desk on the 1st floor of the Medical Mall. ?To find out your arrival time, please call [phone] between 1PM - 3PM on: 06/14/21 - Thursday ? ?REMEMBER: ?Instructions that are not followed completely may result in serious medical risk, up to and including death; or upon the discretion of your surgeon and anesthesiologist your surgery may need to be rescheduled. ? ?Do not eat food after midnight the night before surgery.  ?No gum chewing, lozengers or hard candies. ? ?You may however, drink CLEAR liquids up to 2 hours before you are scheduled to arrive for your surgery. Do not drink anything within 2 hours of your scheduled arrival time. ? ?Clear liquids include: ?- water  ?- apple juice without pulp ?- gatorade (not RED colors) ?- black coffee or tea (Do NOT add milk or creamers to the coffee or tea) ?Do NOT drink anything that is not on this list. ? ?TAKE THESE MEDICATIONS THE MORNING OF SURGERY WITH A SIP OF WATER: ?- varenicline (CHANTIX ) ? ?One week prior to surgery: ?Stop Anti-inflammatories (NSAIDS) such as Advil, Aleve, Ibuprofen, Motrin, Naproxen, Naprosyn and Aspirin based products such as Excedrin, Goodys Powder, BC Powder. ? ?Stop ANY OVER THE COUNTER supplements until after surgery. ? ?You may take Tylenol if needed for pain up until the day of surgery. ? ?No Alcohol for 24 hours before or after surgery. ? ?No Smoking including e-cigarettes for 24 hours prior to surgery.  ?No chewable tobacco products for at least 6 hours prior to surgery.  ?No nicotine patches on the day of surgery. ? ?Do not use any "recreational" drugs for at least a week prior to your surgery.  ?Please be advised that the combination of cocaine and anesthesia may have negative outcomes, up to and including death. ?If you test positive for cocaine, your surgery will be cancelled. ? ?On the morning of surgery brush your teeth with toothpaste and water,  you may rinse your mouth with mouthwash if you wish. ?Do not swallow any toothpaste or mouthwash. ? ?Use CHG Soap or wipes as directed on instruction sheet. ? ?Do not wear jewelry, make-up, hairpins, clips or nail polish. ? ?Do not wear lotions, powders, or perfumes.  ? ?Do not shave body from the neck down 48 hours prior to surgery just in case you cut yourself which could leave a site for infection.  ?Also, freshly shaved skin may become irritated if using the CHG soap. ? ?Contact lenses, hearing aids and dentures may not be worn into surgery. ? ?Do not bring valuables to the hospital. [facility] is not responsible for any missing/lost belongings or valuables.  ? ?Notify your doctor if there is any change in your medical condition (cold, fever, infection). ? ?Wear comfortable clothing (specific to your surgery type) to the hospital. ? ?After surgery, you can help prevent lung complications by doing breathing exercises.  ?Take deep breaths and cough every 1-2 hours. Your doctor may order a device called an Incentive Spirometer to help you take deep breaths. ?When coughing or sneezing, hold a pillow firmly against your incision with both hands. This is called ?splinting.? Doing this helps protect your incision. It also decreases belly discomfort. ? ?If you are being admitted to the hospital overnight, leave your suitcase in the car. ?After surgery it may be brought to your room. ? ?If you are being discharged the day of surgery, you will not be allowed to drive  home. ?You will need a responsible adult (18 years or older) to drive you home and stay with you that night.  ? ?If you are taking public transportation, you will need to have a responsible adult (18 years or older) with you. ?Please confirm with your physician that it is acceptable to use public transportation.  ? ?Please call the [facility] at [phone] if you have any questions about these instructions. ? ?Surgery Visitation  Policy: ? ?Patients undergoing a surgery or procedure may have two family members or support persons with them as long as the person is not COVID-19 positive or experiencing its symptoms.  ? ?Inpatient Visitation:   ? ?Visiting hours are 7 a.m. to 8 p.m. ?Up to four visitors are allowed at one time in a patient room, including children. The visitors may rotate out with other people during the day. One designated support person (adult) may remain overnight.  ?

## 2021-06-08 NOTE — Telephone Encounter (Signed)
Not on current med list. ?Requested Prescriptions  ?Pending Prescriptions Disp Refills  ?? buPROPion (WELLBUTRIN SR) 200 MG 12 hr tablet [Pharmacy Med Name: BUPROPION SR 200MG  TABLETS (12 HR)] 90 tablet 1  ?  Sig: TAKE 1 TABLET(200 MG) BY MOUTH DAILY  ?  ? Psychiatry: Antidepressants - bupropion Failed - 06/07/2021  3:33 PM  ?  ?  Failed - ALT in normal range and within 360 days  ?  ALT  ?Date Value Ref Range Status  ?04/26/2021 35 (H) 0 - 32 IU/L Final  ? ?SGPT (ALT)  ?Date Value Ref Range Status  ?02/08/2013 36 12 - 78 U/L Final  ?   ?  ?  Failed - Last BP in normal range  ?  BP Readings from Last 1 Encounters:  ?05/15/21 (!) 151/79  ?   ?  ?  Passed - Cr in normal range and within 360 days  ?  Creatinine  ?Date Value Ref Range Status  ?02/08/2013 0.66 0.60 - 1.30 mg/dL Final  ? ?Creatinine, Ser  ?Date Value Ref Range Status  ?04/26/2021 0.70 0.57 - 1.00 mg/dL Final  ?   ?  ?  Passed - AST in normal range and within 360 days  ?  AST  ?Date Value Ref Range Status  ?04/26/2021 30 0 - 40 IU/L Final  ? ?SGOT(AST)  ?Date Value Ref Range Status  ?02/08/2013 19 15 - 37 Unit/L Final  ?   ?  ?  Passed - Completed PHQ-2 or PHQ-9 in the last 360 days  ?  ?  Passed - Valid encounter within last 6 months  ?  Recent Outpatient Visits   ?      ? 4 days ago Cigarette smoker  ? [facility], NP  ? 1 month ago Annual physical exam  ? [facility], NP  ? 6 months ago Cigarette smoker  ? [facility], NP  ? 7 months ago Need for influenza vaccination  ? [facility], NP  ? 1 year ago Reactive depression  ? [facility] [place], [place], DO  ?  ?  ?Future Appointments   ?        ? In 1 month [place], NP [facility], [facility]  ? In 4 months [facility], NP [facility]  ?  ? ?  ?  ?  ? ? ?

## 2021-06-11 DIAGNOSIS — E669 Obesity, unspecified: Secondary | ICD-10-CM | POA: Diagnosis not present

## 2021-06-14 MED ORDER — APREPITANT 40 MG PO CAPS
40.0000 mg | ORAL_CAPSULE | Freq: Once | ORAL | Status: AC
  Administered 2021-06-15: 40 mg via ORAL

## 2021-06-14 MED ORDER — CEFAZOLIN SODIUM-DEXTROSE 2-4 GM/100ML-% IV SOLN
2.0000 g | INTRAVENOUS | Status: AC
  Administered 2021-06-15: 2 g via INTRAVENOUS

## 2021-06-14 MED ORDER — LACTATED RINGERS IV SOLN: INTRAVENOUS | Status: DC

## 2021-06-14 MED ORDER — CHLORHEXIDINE GLUCONATE CLOTH 2 % EX PADS
6.0000 | MEDICATED_PAD | Freq: Once | CUTANEOUS | Status: AC
  Administered 2021-06-14: 6 via TOPICAL

## 2021-06-14 MED ORDER — FAMOTIDINE 20 MG PO TABS
20.0000 mg | ORAL_TABLET | Freq: Once | ORAL | Status: AC
  Administered 2021-06-15: 20 mg via ORAL

## 2021-06-14 MED ORDER — CHLORHEXIDINE GLUCONATE CLOTH 2 % EX PADS
6.0000 | MEDICATED_PAD | Freq: Once | CUTANEOUS | Status: AC
  Administered 2021-06-15: 6 via TOPICAL

## 2021-06-14 MED ORDER — CHLORHEXIDINE GLUCONATE 0.12 % MT SOLN
15.0000 mL | Freq: Once | OROMUCOSAL | Status: AC
Start: 2021-06-14 — End: 2021-06-15

## 2021-06-14 MED ORDER — ORAL CARE MOUTH RINSE: 15.0000 mL | Freq: Once | OROMUCOSAL | Status: AC

## 2021-06-14 MED ORDER — GABAPENTIN 300 MG PO CAPS
300.0000 mg | ORAL_CAPSULE | ORAL | Status: AC
  Administered 2021-06-15: 300 mg via ORAL

## 2021-06-14 MED ORDER — BUPIVACAINE LIPOSOME 1.3 % IJ SUSP: 20.0000 mL | Freq: Once | INTRAMUSCULAR | Status: DC

## 2021-06-14 MED ORDER — ACETAMINOPHEN 500 MG PO TABS
1000.0000 mg | ORAL_TABLET | ORAL | Status: AC
  Administered 2021-06-15: 1000 mg via ORAL

## 2021-06-15 ENCOUNTER — Encounter: Payer: Self-pay | Admitting: Surgery

## 2021-06-15 ENCOUNTER — Ambulatory Visit
Admission: RE | Admit: 2021-06-15 | Discharge: 2021-06-15 | Disposition: A | Discharge status: Home / Self Care | Payer: BC Managed Care – PPO | Attending: Surgery | Admitting: Surgery

## 2021-06-15 ENCOUNTER — Ambulatory Visit
Admission: RE | Admit: 2021-06-15 | Discharge: 2021-06-15 | Disposition: A | Discharge status: Home / Self Care | Payer: BC Managed Care – PPO | Source: Ambulatory Visit | Attending: Surgery | Admitting: Surgery

## 2021-06-15 ENCOUNTER — Encounter
Admission: RE | Disposition: A | Discharge status: Home / Self Care | Payer: Self-pay | Source: Home / Self Care | Attending: Surgery

## 2021-06-15 ENCOUNTER — Ambulatory Visit: Payer: BC Managed Care – PPO | Admitting: Anesthesiology

## 2021-06-15 ENCOUNTER — Other Ambulatory Visit: Payer: Self-pay

## 2021-06-15 DIAGNOSIS — F32A Depression, unspecified: Secondary | ICD-10-CM | POA: Diagnosis not present

## 2021-06-15 DIAGNOSIS — N6091 Unspecified benign mammary dysplasia of right breast: Secondary | ICD-10-CM | POA: Insufficient documentation

## 2021-06-15 DIAGNOSIS — G4700 Insomnia, unspecified: Secondary | ICD-10-CM | POA: Insufficient documentation

## 2021-06-15 DIAGNOSIS — N6099 Unspecified benign mammary dysplasia of unspecified breast: Secondary | ICD-10-CM

## 2021-06-15 DIAGNOSIS — R928 Other abnormal and inconclusive findings on diagnostic imaging of breast: Secondary | ICD-10-CM

## 2021-06-15 DIAGNOSIS — N6081 Other benign mammary dysplasias of right breast: Secondary | ICD-10-CM | POA: Diagnosis not present

## 2021-06-15 HISTORY — PX: BREAST BIOPSY WITH RADIO FREQUENCY LOCALIZER: SHX6895

## 2021-06-15 HISTORY — PX: BREAST EXCISIONAL BIOPSY: SUR124

## 2021-06-15 SURGERY — BREAST BIOPSY WITH RADIO FREQUENCY LOCALIZER
Anesthesia: General | Site: Breast | Laterality: Right

## 2021-06-15 MED ORDER — STERILE WATER FOR IRRIGATION IR SOLN
Status: DC | PRN
  Administered 2021-06-15: 500 mL

## 2021-06-15 MED ORDER — FENTANYL CITRATE (PF) 100 MCG/2ML IJ SOLN
INTRAMUSCULAR | Status: AC
  Filled 2021-06-15: qty 2

## 2021-06-15 MED ORDER — PROPOFOL 500 MG/50ML IV EMUL
INTRAVENOUS | Status: AC
  Filled 2021-06-15: qty 50

## 2021-06-15 MED ORDER — OXYCODONE HCL 5 MG PO TABS: 5.0000 mg | ORAL_TABLET | Freq: Once | ORAL | Status: AC

## 2021-06-15 MED ORDER — LIDOCAINE HCL (CARDIAC) PF 100 MG/5ML IV SOSY
PREFILLED_SYRINGE | INTRAVENOUS | Status: DC | PRN
  Administered 2021-06-15: 80 mg via INTRAVENOUS

## 2021-06-15 MED ORDER — MIDAZOLAM HCL 2 MG/2ML IJ SOLN
INTRAMUSCULAR | Status: AC
  Filled 2021-06-15: qty 2

## 2021-06-15 MED ORDER — PROPOFOL 500 MG/50ML IV EMUL
INTRAVENOUS | Status: DC | PRN
  Administered 2021-06-15: 200 mg via INTRAVENOUS

## 2021-06-15 MED ORDER — GABAPENTIN 300 MG PO CAPS
ORAL_CAPSULE | ORAL | Status: AC
  Filled 2021-06-15: qty 1

## 2021-06-15 MED ORDER — HYDROCODONE-ACETAMINOPHEN 5-325 MG PO TABS: 1.0000 | ORAL_TABLET | Freq: Four times a day (QID) | ORAL | 0 refills | Status: DC | PRN

## 2021-06-15 MED ORDER — MIDAZOLAM HCL 2 MG/2ML IJ SOLN
INTRAMUSCULAR | Status: DC | PRN
  Administered 2021-06-15: 2 mg via INTRAVENOUS

## 2021-06-15 MED ORDER — OXYCODONE HCL 5 MG PO TABS
ORAL_TABLET | ORAL | Status: AC
  Administered 2021-06-15: 5 mg via ORAL
  Filled 2021-06-15: qty 1

## 2021-06-15 MED ORDER — APREPITANT 40 MG PO CAPS
ORAL_CAPSULE | ORAL | Status: AC
  Filled 2021-06-15: qty 1

## 2021-06-15 MED ORDER — BUPIVACAINE-EPINEPHRINE (PF) 0.25% -1:200000 IJ SOLN
INTRAMUSCULAR | Status: AC
  Filled 2021-06-15: qty 30

## 2021-06-15 MED ORDER — CEFAZOLIN SODIUM-DEXTROSE 2-4 GM/100ML-% IV SOLN
INTRAVENOUS | Status: AC
  Filled 2021-06-15: qty 100

## 2021-06-15 MED ORDER — BUPIVACAINE-EPINEPHRINE (PF) 0.25% -1:200000 IJ SOLN
INTRAMUSCULAR | Status: DC | PRN
Start: 2021-06-15 — End: 2021-06-15
  Administered 2021-06-15: 20 mL

## 2021-06-15 MED ORDER — CHLORHEXIDINE GLUCONATE 0.12 % MT SOLN
OROMUCOSAL | Status: AC
  Administered 2021-06-15: 15 mL via OROMUCOSAL
  Filled 2021-06-15: qty 15

## 2021-06-15 MED ORDER — FENTANYL CITRATE (PF) 100 MCG/2ML IJ SOLN
INTRAMUSCULAR | Status: DC | PRN
  Administered 2021-06-15: 100 ug via INTRAVENOUS

## 2021-06-15 MED ORDER — FENTANYL CITRATE (PF) 100 MCG/2ML IJ SOLN
25.0000 ug | INTRAMUSCULAR | Status: DC | PRN
  Administered 2021-06-15: 25 ug via INTRAVENOUS

## 2021-06-15 MED ORDER — DEXAMETHASONE SODIUM PHOSPHATE 10 MG/ML IJ SOLN
INTRAMUSCULAR | Status: DC | PRN
Start: 2021-06-15 — End: 2021-06-15
  Administered 2021-06-15: 10 mg via INTRAVENOUS

## 2021-06-15 MED ORDER — ONDANSETRON HCL 4 MG/2ML IJ SOLN: 4.0000 mg | Freq: Once | INTRAMUSCULAR | Status: DC | PRN

## 2021-06-15 MED ORDER — FAMOTIDINE 20 MG PO TABS
ORAL_TABLET | ORAL | Status: AC
Start: 2021-06-15 — End: 2021-06-15
  Filled 2021-06-15: qty 1

## 2021-06-15 MED ORDER — EPHEDRINE SULFATE (PRESSORS) 50 MG/ML IJ SOLN
INTRAMUSCULAR | Status: DC | PRN
  Administered 2021-06-15: 10 mg via INTRAVENOUS
  Administered 2021-06-15: 5 mg via INTRAVENOUS
  Administered 2021-06-15: 10 mg via INTRAVENOUS

## 2021-06-15 MED ORDER — BUPIVACAINE LIPOSOME 1.3 % IJ SUSP
INTRAMUSCULAR | Status: AC
  Filled 2021-06-15: qty 20

## 2021-06-15 MED ORDER — LACTATED RINGERS IV SOLN: INTRAVENOUS | Status: DC | PRN

## 2021-06-15 MED ORDER — ONDANSETRON HCL 4 MG/2ML IJ SOLN
INTRAMUSCULAR | Status: DC | PRN
  Administered 2021-06-15: 4 mg via INTRAVENOUS

## 2021-06-15 MED ORDER — ACETAMINOPHEN 500 MG PO TABS
ORAL_TABLET | ORAL | Status: AC
  Filled 2021-06-15: qty 2

## 2021-06-15 SURGICAL SUPPLY — 41 items
ADH SKN CLS APL DERMABOND .7 (GAUZE/BANDAGES/DRESSINGS) ×1
APL PRP STRL LF DISP 70% ISPRP (MISCELLANEOUS) ×1
APPLIER CLIP 9.375 SM OPEN (CLIP)
APR CLP SM 9.3 20 MLT OPN (CLIP)
BLADE SURG 15 STRL LF DISP TIS (BLADE) ×1 IMPLANT
BLADE SURG 15 STRL SS (BLADE) ×2
CHLORAPREP W/TINT 26 (MISCELLANEOUS) ×2 IMPLANT
CLIP APPLIE 9.375 SM OPEN (CLIP) IMPLANT
CNTNR SPEC 2.5X3XGRAD LEK (MISCELLANEOUS)
CONT SPEC 4OZ STER OR WHT (MISCELLANEOUS)
CONT SPEC 4OZ STRL OR WHT (MISCELLANEOUS)
CONTAINER SPEC 2.5X3XGRAD LEK (MISCELLANEOUS) IMPLANT
DERMABOND ADVANCED (GAUZE/BANDAGES/DRESSINGS) ×1
DERMABOND ADVANCED .7 DNX12 (GAUZE/BANDAGES/DRESSINGS) ×1 IMPLANT
DEVICE DUBIN SPECIMEN MAMMOGRA (MISCELLANEOUS) ×2 IMPLANT
DRAPE LAPAROTOMY TRNSV 106X77 (MISCELLANEOUS) ×2 IMPLANT
ELECT CAUTERY BLADE TIP 2.5 (TIP) ×2
ELECT REM PT RETURN 9FT ADLT (ELECTROSURGICAL) ×2
ELECTRODE CAUTERY BLDE TIP 2.5 (TIP) ×1 IMPLANT
ELECTRODE REM PT RTRN 9FT ADLT (ELECTROSURGICAL) ×1 IMPLANT
GAUZE 4X4 16PLY ~~LOC~~+RFID DBL (SPONGE) ×2 IMPLANT
GLOVE ORTHO TXT STRL SZ7.5 (GLOVE) ×2 IMPLANT
GOWN STRL REUS W/ TWL LRG LVL3 (GOWN DISPOSABLE) ×1 IMPLANT
GOWN STRL REUS W/ TWL XL LVL3 (GOWN DISPOSABLE) ×1 IMPLANT
GOWN STRL REUS W/TWL LRG LVL3 (GOWN DISPOSABLE) ×2
GOWN STRL REUS W/TWL XL LVL3 (GOWN DISPOSABLE) ×2
KIT MARKER MARGIN INK (KITS) IMPLANT
KIT TURNOVER KIT A (KITS) ×2 IMPLANT
MANIFOLD NEPTUNE II (INSTRUMENTS) ×2 IMPLANT
NEEDLE HYPO 22GX1.5 SAFETY (NEEDLE) ×2 IMPLANT
PACK BASIN MINOR ARMC (MISCELLANEOUS) ×2 IMPLANT
SET LOCALIZER 20 PROBE US (MISCELLANEOUS) ×2 IMPLANT
SUT MNCRL 4-0 (SUTURE) ×2
SUT MNCRL 4-0 27XMFL (SUTURE) ×1
SUT VIC AB 3-0 SH 27 (SUTURE) ×2
SUT VIC AB 3-0 SH 27X BRD (SUTURE) ×1 IMPLANT
SUTURE MNCRL 4-0 27XMF (SUTURE) ×1 IMPLANT
SYR 20ML LL LF (SYRINGE) ×2 IMPLANT
TRAP NEPTUNE SPECIMEN COLLECT (MISCELLANEOUS) ×2 IMPLANT
WATER STERILE IRR 1000ML POUR (IV SOLUTION) ×2 IMPLANT
WATER STERILE IRR 500ML POUR (IV SOLUTION) ×2 IMPLANT

## 2021-06-15 NOTE — Anesthesia Preprocedure Evaluation (Signed)
Anesthesia Evaluation  ?Patient identified by MRN, date of birth, ID band ?Patient awake ? ? ? ?Reviewed: ?Allergy & Precautions, NPO status , Patient's Chart, lab work & pertinent test results ? ?Airway ?Mallampati: II ? ?TM Distance: >3 FB ?Neck ROM: Full ? ? ? Dental ? ?(+) Teeth Intact ?  ?Pulmonary ?neg pulmonary ROS, former smoker,  ?  ?Pulmonary exam normal ?breath sounds clear to auscultation ? ? ? ? ? ? Cardiovascular ?Exercise Tolerance: Good ?negative cardio ROS ?Normal cardiovascular exam ?Rhythm:Regular Rate:Normal ? ? ?  ?Neuro/Psych ? Headaches, Depression negative neurological ROS ? negative psych ROS  ? GI/Hepatic ?negative GI ROS, Neg liver ROS,   ?Endo/Other  ?negative endocrine ROS ? Renal/GU ?negative Renal ROS  ?negative genitourinary ?  ?Musculoskeletal ? ? Abdominal ?Normal abdominal exam  (+)   ?Peds ?negative pediatric ROS ?(+)  Hematology ?negative hematology ROS ?(+)   ?Anesthesia Other Findings ?Past Medical History: ?No date: Complication of anesthesia ?No date: Insomnia ?No date: Perimenopausal ?No date: PONV (postoperative nausea and vomiting) ? ?Past Surgical History: ?05/09/2021: BREAST BIOPSY; Right ?    Comment:  right stereo bx ribbon clip ATYPICAL DUCTAL HYPERPLASIA, ?             INVOLVING COLUMNARCELL LESION, WITH ASSOCIATED COARSE  ?             CALCIFICATIONS - BACKGROUNDMAMMARY PARENCHYMA DISPLAYING  ?             FIBROCYSTIC AND APOCRINE CHANGES, ANDFOCAL  ?             PSEUDOANGIOMATOUS STROMAL HYPERPLASIA (PASH) - NEGATIVE  ?             FORCARCINOMA IN SITU AND INVASIVE MAMMARY CARCINOMA of  ?             the RIGHT ?No date: BREAST LUMPECTOMY; Right ?05/24/2021: BREAST LUMPECTOMY WITH RADIOFREQUENCY TAG IDENTIFICATION;  ?Right ?No date: CESAREAN SECTION ?    Comment:  2 ?No date: TUBAL LIGATION ? ? ? ? Reproductive/Obstetrics ?negative OB ROS ? ?  ? ? ? ? ? ? ? ? ? ? ? ? ? ?  ?  ? ? ? ? ? ? ? ? ?Anesthesia Physical ?Anesthesia  Plan ? ?ASA: 2 ? ?Anesthesia Plan: General  ? ?Post-op Pain Management:   ? ?Induction: Intravenous ? ?PONV Risk Score and Plan: 1 and Ondansetron and Dexamethasone ? ?Airway Management Planned: LMA ? ?Additional Equipment:  ? ?Intra-op Plan:  ? ?Post-operative Plan: Extubation in OR ? ?Informed Consent: I have reviewed the patients History and Physical, chart, labs and discussed the procedure including the risks, benefits and alternatives for the proposed anesthesia with the patient or authorized representative who has indicated his/her understanding and acceptance.  ? ? ? ?Dental Advisory Given ? ?Plan Discussed with: CRNA and Surgeon ? ?Anesthesia Plan Comments:   ? ? ? ? ? ? ?Anesthesia Quick Evaluation ? ?

## 2021-06-15 NOTE — Op Note (Signed)
?  Pre-operative Diagnosis: Atypical ductal hyperplasia right breast   ? ?Post-operative Diagnosis: Same ? ?Surgeon: Campbell Lerner, M.D., FACS ? ?Anesthesia: General ? ?Procedure: Right breast excisional biopsy, RFID tag directed ? ?Procedure Details  ?The patient was seen again in the Holding Room. The benefits, complications, treatment options, and expected outcomes were discussed with the patient. The risks of bleeding, infection, recurrence of symptoms, failure to resolve symptoms, hematoma, seroma, open wound, cosmetic deformity, and the need for further surgery were discussed.  The patient was taken to Operating Room, identified as [name] and the procedure verified.  A Time Out was held and the above information confirmed. ? ?Prior to the induction of general anesthesia, antibiotic prophylaxis was administered. VTE prophylaxis was in place. The patient was positioned in the supine position. Appropriate anesthesia was then administered and tolerated well.  ?The chest was prepped with Chloraprep and draped in the sterile fashion.  ? ?Attention was turned to the RFID tag localization site where an incision was made. Dissection using the LOCALizer to perform a lumpectomy with adequate margins was performed. This was done with electrocautery and sharp dissection with Mayo scissors. There was minimal bleeding, and the cavity packed.   I returned to the cavity to remove the packing, and hemostasis was confirmed with electrocautery.   Once assuring that hemostasis was adequate and checked multiple times the wound was closed with interrupted 3-0 Vicryl followed by 4-0 subcuticular Monocryl sutures. ? ?The axillary wound was closed in a similar fashion. ?Dermabond is utilized to seal the incision.  ? ? ?Findings: Faxitron imaging: Shows both markers within the specimen with adequate margin. ? ?Estimated Blood Loss: Minimal ?        ?Drains: None ?        ?Specimens: Upper outer quadrant right breast  tissue. ?      ?Complications: None. ?        ?Condition: Stable ? ? ?Campbell Lerner, M.D., FACS ?[facility] ? ?06/15/2021 ; 8:43 AM ? ? ? ? ? ?

## 2021-06-15 NOTE — Transfer of Care (Signed)
Immediate Anesthesia Transfer of Care Note ? ?Patient: Megan Mendoza ? ?Procedure(s) Performed: BREAST BIOPSY WITH RADIO FREQUENCY LOCALIZER (Right: Breast) ? ?Patient Location: PACU ? ?Anesthesia Type:General ? ?Level of Consciousness: drowsy ? ?Airway & Oxygen Therapy: Patient Spontanous Breathing and Patient connected to face mask oxygen ? ?Post-op Assessment: Report given to RN and Post -op Vital signs reviewed and stable ? ?Post vital signs: Reviewed and stable ? ?Last Vitals:  ?Vitals Value Taken Time  ?BP    ?Temp    ?Pulse    ?Resp    ?SpO2    ? ? ?Last Pain:  ?Vitals:  ? 06/15/21 0638  ?TempSrc: Temporal  ?PainSc: 0-No pain  ?   ? ?Patients Stated Pain Goal: 0 (06/15/21 9628) ? ?Complications: No notable events documented. ?

## 2021-06-15 NOTE — Interval H&P Note (Signed)
History and Physical Interval Note: ? ?06/15/2021 ?7:25 AM ? ?Megan Mendoza  has presented today for surgery, with the diagnosis of atypical ductal hyperplasia right breast.  The various methods of treatment have been discussed with the patient and family. After consideration of risks, benefits and other options for treatment, the patient has consented to  Procedure(s): ?BREAST BIOPSY WITH RADIO FREQUENCY LOCALIZER (Right) as a surgical intervention.  The patient's history has been reviewed, patient examined, no change in status, stable for surgery.  I have reviewed the patient's chart and labs.  Questions were answered to the patient's satisfaction.   ?The right breast is marked.  ? ?Campbell Lerner ? ? ?

## 2021-06-15 NOTE — Anesthesia Postprocedure Evaluation (Signed)
Anesthesia Post Note ? ?Patient: Megan Mendoza ? ?Procedure(s) Performed: BREAST BIOPSY WITH RADIO FREQUENCY LOCALIZER (Right: Breast) ? ?Patient location during evaluation: PACU ?Anesthesia Type: General ?Level of consciousness: awake and awake and alert ?Pain management: pain level controlled ?Vital Signs Assessment: post-procedure vital signs reviewed and stable ?Respiratory status: spontaneous breathing ?Cardiovascular status: stable ?Anesthetic complications: no ? ? ?No notable events documented. ? ? ?Last Vitals:  ?Vitals:  ? 06/15/21 0930 06/15/21 0939  ?BP: 112/71 120/87  ?Pulse: 80 80  ?Resp: 16 18  ?Temp: 36.6 ?C 36.9 ?C  ?SpO2: 95% 97%  ?  ?Last Pain:  ?Vitals:  ? 06/15/21 0939  ?TempSrc: Temporal  ?PainSc: 3   ? ? ?  ?  ?  ?  ?  ?  ? ?VAN STAVEREN,Riko Lumsden ? ? ? ? ?

## 2021-06-15 NOTE — Discharge Instructions (Signed)
AMBULATORY SURGERY  ?DISCHARGE INSTRUCTIONS ? ? ?The drugs that you were given will stay in your system until tomorrow so for the next 24 hours you should not: ? ?Drive an automobile ?Make any legal decisions ?Drink any alcoholic beverage ? ? ?You may resume regular meals tomorrow.  Today it is better to start with liquids and gradually work up to solid foods. ? ?You may eat anything you prefer, but it is better to start with liquids, then soup and crackers, and gradually work up to solid foods. ? ? ?Please notify your doctor immediately if you have any unusual bleeding, trouble breathing, redness and pain at the surgery site, drainage, fever, or pain not relieved by medication. ? ? ? ?Additional Instructions: ? ? ? ?Please contact your physician with any problems or Same Day Surgery at [phone], Monday through Friday 6 am to 4 pm, or [place] [facility] [place] Main number at [phone].  ?

## 2021-06-15 NOTE — Anesthesia Procedure Notes (Signed)
Procedure Name: LMA Insertion ?Date/Time: 06/15/2021 7:38 AM ?Performed by: Philbert Riser, CRNA ?Pre-anesthesia Checklist: Patient identified, Patient being monitored, Timeout performed, Emergency Drugs available and Suction available ?Patient Re-evaluated:Patient Re-evaluated prior to induction ?Oxygen Delivery Method: Circle system utilized ?Preoxygenation: Pre-oxygenation with 100% oxygen ?Induction Type: IV induction ?Ventilation: Mask ventilation without difficulty ?LMA: LMA inserted ?LMA Size: 3.5 ?Tube type: Oral ?Number of attempts: 1 ?Placement Confirmation: positive ETCO2 and breath sounds checked- equal and bilateral ?Tube secured with: Tape ?Dental Injury: Teeth and Oropharynx as per pre-operative assessment  ? ? ? ? ?

## 2021-06-15 NOTE — H&P (Signed)
Chief Complaint: Atypical ductal hyperplasia, right breast ?  ?History of Present Illness ?Megan Mendoza is a 51 y.o. female with a recent screening mammogram, with diagnostic mammogram confirming suspicion of a 4 mm area of microcalcifications upper outer quadrant right breast.  Stereotactic core biopsy obtained confirms atypical ductal hyperplasia without any evidence of carcinoma in situ or invasive cancer.  She presents here to be evaluated for excisional biopsy ?She reports utilization of birth control.  She is postmenopausal.  She denies any family history of breast cancer.  She began menstruating at the age of 29.  She has been pregnant 4 times delivering 3 children, with 1 AB.  Her first pregnancy occurred at the age of 73, current children are aged 56, 80 and 37. ?She denies any prior breast issues, never breast-feeding, no breast pain or nipple discharge, no skin changes nor palpable lumps. ?  ?Past Medical History ?    ?Past Medical History:  ?Diagnosis Date  ? Insomnia    ? Perimenopausal    ?  ?  ?  ?     ?Past Surgical History:  ?Procedure Laterality Date  ? BREAST BIOPSY Right 05/09/2021  ?  right stereo bx ribbon clip path pending  ? CESAREAN SECTION      ?  2  ? TUBAL LIGATION      ?  ?  ?    ?Allergies  ?Allergen Reactions  ? Aspirin Nausea And Vomiting  ?  ?  ?      ?Current Outpatient Medications  ?Medication Sig Dispense Refill  ? escitalopram (LEXAPRO) 5 MG tablet Take 1 tab by mouth daily. 90 tablet 1  ? traZODone (DESYREL) 100 MG tablet Take 1 tablet (100 mg total) by mouth at bedtime. TAKE 1 TABLET BY MOUTH AT BEDTIME AS NEEDED FOR SLEEP 90 tablet 1  ? Varenicline Tartrate, Starter, (CHANTIX STARTING MONTH PAK) 0.5 MG X 11 & 1 MG X 42 TBPK Take 0.5 mg by mouth daily. Take one 0.5 mg tablet by mouth once daily for 3 days, then increase to one 0.5 mg tablet twice daily for 4 days, then increase to one 1 mg tablet twice daily. 60 each 0  ?  ?No current facility-administered medications for  this visit.  ?  ?  ?Family History ?     ?Family History  ?Problem Relation Age of Onset  ? Heart disease Mother    ? Lung cancer Father    ? Asthma Son    ? ADD / ADHD Son    ? Breast cancer Neg Hx    ?  ?  ?  ?Social History ?Social History  ?  ?     ?Tobacco Use  ? Smoking status: Every Day  ?    Packs/day: 1.00  ?    Types: Cigarettes  ? Smokeless tobacco: Never  ? Tobacco comments:  ?    pt states she stopped smoking 4 days ago  ?Vaping Use  ? Vaping Use: Never used  ?Substance Use Topics  ? Alcohol use: Yes  ?    Comment: ocassionally  ? Drug use: Never  ?  ?  ?  ?  ?Review of Systems  ?Constitutional: Negative.   ?HENT: Negative.    ?Eyes: Negative.   ?Respiratory: Negative.    ?Cardiovascular: Negative.   ?Gastrointestinal: Negative.   ?Genitourinary: Negative.   ?Skin: Negative.   ?Neurological: Negative.   ?Psychiatric/Behavioral: Negative.    ?  ?  ?Physical Exam ?Blood  pressure (!) 151/79, pulse 85, temperature 98.3 ?F (36.8 ?C), temperature source Oral, height 5\' 5"  (1.651 m), weight 189 lb 12.8 oz (86.1 kg), ? ?  ?CONSTITUTIONAL: Well developed, and nourished, appropriately responsive and aware without distress.   ?EYES: Sclera non-icteric.   ?EARS, NOSE, MOUTH AND THROAT:  The oropharynx is clear. Oral mucosa is pink and moist.    Hearing is intact to voice.  ?NECK: Trachea is midline, and there is no jugular venous distension.  ?LYMPH NODES:  Lymph nodes in the neck are not enlarged. ?RESPIRATORY:  Lungs are clear, and breath sounds are equal bilaterally. Normal respiratory effort without pathologic use of accessory muscles. ?CARDIOVASCULAR: Heart is regular in rate and rhythm. ?GI: The abdomen is soft, nontender, and nondistended.  ?GU: Essentially deferred due to bruising. ?MUSCULOSKELETAL:  Symmetrical muscle tone appreciated in all four extremities.    ?SKIN: Skin turgor is normal. No pathologic skin lesions appreciated.  ?NEUROLOGIC:  Motor and sensation appear grossly normal.  Cranial nerves  are grossly without defect. ?PSYCH:  Alert and oriented to person, place and time. Affect is appropriate for situation. ?  ?Data Reviewed ?I have personally reviewed what is currently available of the patient's imaging, recent labs and medical records.   ?Labs:  ?  ?  Latest Ref Rng & Units 04/26/2021  ?  8:32 AM 04/07/2019  ?  3:52 PM 02/08/2013  ?  2:19 PM  ?CBC  ?WBC 3.4 - 10.8 x10E3/uL 10.0   7.4   8.2    ?Hemoglobin 11.1 - 15.9 g/dL 91.415.7   78.215.3   95.614.6    ?Hematocrit 34.0 - 46.6 % 45.8   46.7   44.5    ?Platelets 150 - 450 x10E3/uL 220   217   203    ?  ?  ?  Latest Ref Rng & Units 04/26/2021  ?  8:32 AM 04/07/2019  ?  3:52 PM 02/08/2013  ?  2:19 PM  ?CMP  ?Glucose 70 - 99 mg/dL 81   80   90    ?BUN 6 - 24 mg/dL 15   17   12     ?Creatinine 0.57 - 1.00 mg/dL 2.130.70   0.860.95   5.780.66    ?Sodium 134 - 144 mmol/L 137   140   137    ?Potassium 3.5 - 5.2 mmol/L 4.4   4.5   3.7    ?Chloride 96 - 106 mmol/L 99   101   107    ?CO2 20 - 29 mmol/L 23   23   24     ?Calcium 8.7 - 10.2 mg/dL 9.6   46.910.0   9.6    ?Total Protein 6.0 - 8.5 g/dL 7.1   7.2   8.2    ?Total Bilirubin 0.0 - 1.2 mg/dL 0.3   0.3   0.3    ?Alkaline Phos 44 - 121 IU/L 86   93   96    ?AST 0 - 40 IU/L 30   20   19     ?ALT 0 - 32 IU/L 35   27   36    ?  ?SURGICAL PATHOLOGY  ?CASE: [id number]  ?PATIENT: Megan Mendoza  ?Surgical Pathology Report  ? ?Specimen Submitted:  ?A. Breast, right UOQ  ? ?Clinical History: CALCS.  R/O DCIS. Ribbon-shaped clip placed following  ?stereotactic biopsy of RIGHT breast, upper outer quadrant.  ? ?DIAGNOSIS:  ?A. BREAST, RIGHT UPPER OUTER QUADRANT; STEREOTACTIC CORE NEEDLE BIOPSY:  ?- ATYPICAL DUCTAL  HYPERPLASIA, INVOLVING COLUMNAR CELL LESION, WITH  ?ASSOCIATED COARSE CALCIFICATIONS.  ?- BACKGROUND MAMMARY PARENCHYMA DISPLAYING FIBROCYSTIC AND APOCRINE  ?CHANGES, AND FOCAL PSEUDOANGIOMATOUS STROMAL HYPERPLASIA (PASH).  ?- NEGATIVE FOR CARCINOMA IN SITU AND INVASIVE MAMMARY CARCINOMA.  ? ?Comment:  ?ADH involves 4 of 5 sampled tissue  blocks, measuring up to 4 mm in  ?greatest linear extent.  ?  ?  ?Imaging: ?Radiology review:  ?CLINICAL DATA:  51 year old female for further evaluation of RIGHT ?breast calcifications identified on screening mammogram. ?  ?EXAM: ?DIGITAL DIAGNOSTIC UNILATERAL RIGHT MAMMOGRAM WITH TOMOSYNTHESIS AND ?CAD ?  ?TECHNIQUE: ?Right digital diagnostic mammography and breast tomosynthesis was ?performed. The images were evaluated with computer-aided detection. ?  ?COMPARISON:  Previous exam(s). ?  ?ACR Breast Density Category c: The breast tissue is heterogeneously ?dense, which may obscure small masses. ?  ?FINDINGS: ?Full field and magnification views of the RIGHT breast demonstrate a ?0.4 cm group of slightly heterogeneous and amorphous calcifications ?within the UPPER-OUTER RIGHT breast, middle to posterior depth. ?  ?IMPRESSION: ?Indeterminate 0.4 cm group of UPPER-OUTER RIGHT breast ?calcifications. Tissue sampling is recommended. ?  ?RECOMMENDATION: ?3D/stereotactic guided RIGHT breast biopsy, which will be scheduled. ?  ?I have discussed the findings and recommendations with the patient. ?If applicable, a reminder letter will be sent to the patient ?regarding the next appointment. ?  ?BI-RADS CATEGORY  4: Suspicious. ?  ?  ?Electronically Signed ?  By: Harmon Pier M.D. ?  On: 04/23/2021 13:21 ?  ?CLINICAL DATA:  Screening. ?  ?EXAM: ?DIGITAL SCREENING BILATERAL MAMMOGRAM WITH TOMOSYNTHESIS AND CAD ?  ?TECHNIQUE: ?Bilateral screening digital craniocaudal and mediolateral oblique ?mammograms were obtained. Bilateral screening digital breast ?tomosynthesis was performed. The images were evaluated with ?computer-aided detection. ?  ?COMPARISON:  Previous exam(s). ?  ?ACR Breast Density Category c: The breast tissue is heterogeneously ?dense, which may obscure small masses. ?  ?FINDINGS: ?In the right breast, calcifications warrant further evaluation with ?magnified views. In the left breast, no findings suspicious  for ?malignancy. ?  ?IMPRESSION: ?Further evaluation is suggested for calcifications in the right ?breast. ?  ?RECOMMENDATION: ?Diagnostic mammogram of the right breast. (Code:FI-R-74M) ?  ?The patient will

## 2021-06-17 ENCOUNTER — Other Ambulatory Visit: Payer: Self-pay | Admitting: Nurse Practitioner

## 2021-06-18 ENCOUNTER — Encounter: Payer: Self-pay | Admitting: Nurse Practitioner

## 2021-06-18 ENCOUNTER — Ambulatory Visit: Payer: BC Managed Care – PPO | Admitting: Nurse Practitioner

## 2021-06-18 ENCOUNTER — Ambulatory Visit: Payer: Self-pay | Admitting: *Deleted

## 2021-06-18 VITALS — BP 100/66 | HR 65 | Temp 98.0°F | Ht 65.0 in | Wt 193.4 lb

## 2021-06-18 DIAGNOSIS — N950 Postmenopausal bleeding: Secondary | ICD-10-CM | POA: Insufficient documentation

## 2021-06-18 DIAGNOSIS — Z13228 Encounter for screening for other metabolic disorders: Secondary | ICD-10-CM | POA: Insufficient documentation

## 2021-06-18 DIAGNOSIS — Z716 Tobacco abuse counseling: Secondary | ICD-10-CM | POA: Insufficient documentation

## 2021-06-18 LAB — SURGICAL PATHOLOGY

## 2021-06-18 NOTE — Progress Notes (Signed)
? ?BP 100/66   Pulse 65   Temp 98 ?F (36.7 ?C) (Oral)   Ht 5\' 5"  (1.651 m)   Wt 193 lb 6.4 oz (87.7 kg)   LMP 04/28/2018 Comment: not preg  SpO2 98%   BMI 32.18 kg/m?   ? ?Subjective:  ? ? Patient ID: Megan Mendoza, female    DOB: 06/25/1970, 51 y.o.   MRN: [id number] ? ?HPI: ?Megan Mendoza is a 51 y.o. female ? ?Chief Complaint  ?Patient presents with  ? Vaginal Bleeding  ?  Patient states she had breast surgery on Friday and she noticed after her she was discharged from her surgery, she started having vaginal bleeding. Patient states she has not had a cycle in a while. Patient thinks it may be hormonal and wanted to follow up with PCP.   ? ?VAGINAL BLEEDING ?Had breast surgery on Friday, breast biopsy due to atypical ductal hyperplasia -- on 05/15/21.  Prior to this last menstrual bleeding was October 2021.  No other symptoms at this time.  The current bleeding is varying between heavy and light == using pads at this time and is changing 3-4 times a day.  Has seen Dr. Toya Smothers at [facility], but has not seen in many years.  No family history of breast or uterine cancer, but multiple adoptions in family.  She is a current smoker. ?Duration: days ?Pruritus: no ?Dysuria: no ?Malodorous: no ?Urinary frequency: no ?Fevers: no ?Abdominal pain: no  ?Sexual activity: monogamous ?History of sexually transmitted diseases: no ?Treatments attempted: none  ? ?Relevant past medical, surgical, family and social history reviewed and updated as indicated. Interim medical history since our last visit reviewed. ?Allergies and medications reviewed and updated. ? ?Review of Systems  ?Constitutional:  Negative for activity change, appetite change, diaphoresis, fatigue and fever.  ?Respiratory:  Negative for cough, chest tightness and shortness of breath.   ?Cardiovascular:  Negative for chest pain, palpitations and leg swelling.  ?Gastrointestinal: Negative.   ?Genitourinary:  Positive for vaginal bleeding. Negative for  dyspareunia, pelvic pain, vaginal discharge and vaginal pain.  ?Neurological: Negative.   ?Psychiatric/Behavioral: Negative.    ? ?Per HPI unless specifically indicated above ? ?   ?Objective:  ?  ?BP 100/66   Pulse 65   Temp 98 ?F (36.7 ?C) (Oral)   Ht 5\' 5"  (1.651 m)   Wt 193 lb 6.4 oz (87.7 kg)   LMP 04/28/2018 Comment: not preg  SpO2 98%   BMI 32.18 kg/m?   ?Wt Readings from Last 3 Encounters:  ?06/18/21 193 lb 6.4 oz (87.7 kg)  ?06/15/21 188 lb (85.3 kg)  ?05/15/21 189 lb 12.8 oz (86.1 kg)  ?  ?Physical Exam ?Vitals and nursing note reviewed.  ?Constitutional:   ?   General: She is awake. She is not in acute distress. ?   Appearance: She is well-developed and well-groomed. She is obese. She is not ill-appearing or toxic-appearing.  ?HENT:  ?   Head: Normocephalic.  ?   Right Ear: Hearing normal.  ?   Left Ear: Hearing normal.  ?Eyes:  ?   General: Lids are normal.     ?   Right eye: No discharge.     ?   Left eye: No discharge.  ?   Conjunctiva/sclera: Conjunctivae normal.  ?   Pupils: Pupils are equal, round, and reactive to light.  ?Neck:  ?   Thyroid: No thyromegaly.  ?   Vascular: No carotid bruit.  ?Cardiovascular:  ?  Rate and Rhythm: Normal rate and regular rhythm.  ?   Heart sounds: Normal heart sounds. No murmur heard. ?  No gallop.  ?Pulmonary:  ?   Effort: Pulmonary effort is normal. No accessory muscle usage or respiratory distress.  ?   Breath sounds: Normal breath sounds.  ?Abdominal:  ?   General: Bowel sounds are normal.  ?   Palpations: Abdomen is soft. There is no hepatomegaly or splenomegaly.  ?Musculoskeletal:  ?   Cervical back: Normal range of motion and neck supple.  ?   Right lower leg: No edema.  ?   Left lower leg: No edema.  ?Skin: ?   General: Skin is warm and dry.  ?Neurological:  ?   Mental Status: She is alert and oriented to person, place, and time.  ?Psychiatric:     ?   Attention and Perception: Attention normal.     ?   Mood and Affect: Mood normal.     ?   Speech:  Speech normal.     ?   Behavior: Behavior normal. Behavior is cooperative.     ?   Thought Content: Thought content normal.  ? ? ?Results for orders placed or performed during the hospital encounter of 06/15/21  ?Surgical pathology  ?Result Value Ref Range  ? SURGICAL PATHOLOGY    ?  SURGICAL PATHOLOGY ?CASE: [id number] ?PATIENT: Megan Mendoza ?Surgical Pathology Report ? ? ? ? ?Specimen Submitted: ?A. Breast, right upper outer quadrant ? ?Clinical History: Atypical ductal hyperplasia right breast ? ? ? ?DIAGNOSIS: ?A. BREAST, RIGHT UPPER OUTER QUADRANT; EXCISION: ?- BENIGN MAMMARY PARENCHYMA WITH FIBROCYSTIC, APOCRINE, AND COLUMNAR ?CELL CHANGES, WITH FOCAL USUAL DUCTAL HYPERPLASIA AND ASSOCIATED ?CALCIFICATIONS. ?- CLIP, RF TAG, AND BIOPSY SITE IDENTIFIED. ?- NEGATIVE FOR RESIDUAL ATYPICAL DUCTAL HYPERPLASIA. ?- NEGATIVE FOR DUCTAL CARCINOMA IN SITU AND MALIGNANCY. ? ?Comment: ?This case is reviewed together with the prior diagnostic biopsy ?([id number]). ? ?GROSS DESCRIPTION: ?A. Labeled: Upper outer quadrant right breast ?Received: Fresh ?Specimen radiograph image(s) available for review ?Radiographic findings: A ribbon clip and RFID tag are present ?Time in fixative: Collected at 8:06 AM on 06/15/2021 and placed in ?formalin at 8:39 AM on 06/15/2021                                    cold ?ischemic time: Approximately 36 minutes ?Total fixation time: Approximately 11.6 hours ?Type of procedure: Breast biopsy with radiofrequency localizer ?Location / laterality of specimen: Right ?Orientation of specimen: The specimen is received unoriented and is ?inked entirely black ?Size of specimen: 5.7 x 4.7 x 1.2 cm ?Skin: None grossly identified ?Biopsy site: A biopsy site containing a ribbon clip is identified ? ?Number of discrete masses: No discrete masses are identified ?Margins: The clip is 0.2 cm from the closest margin ?Description of remainder of tissue: Sectioning shows yellow, lobular cut ?surfaces with  approximately 40% intermixed fibrous tissue ? ?Block summary: ?1 -clip site ?2-3-tissue immediately surrounding clip site ?4-6-additional representative sections ? ?CM 06/15/2021 ? ? ?Final Diagnosis performed by Allena Napoleon, MD.   Electronically signed ?06/18/2021 8:42:33AM ?The electronic signature indicates that the named Attending Pathol ogist ?has evaluated the specimen ?Technical component performed at [facility], [address] [place], ?[place] [address] Lab: [id number] Dir: Rush Farmer, MD, MMM ? Professional component performed at [facility], [facility], [place], [place], [place] [address] Lab: [phone] ?Dir: Kathi Simpers, MD ?  ? ?   ?  Assessment & Plan:  ? ?Problem List Items Addressed This Visit   ? ?  ? Other  ? Postmenopausal vaginal bleeding - Primary  ?  Started on 05/15/21 after breast biopsy, ?correlation.  LMP was October 2021, no cycles since that time.  Obtain labs today TSH, CBC, CMP, FSH/LH.  Will get imaging with pelvic and transvaginal ultrasound to further assess and urgent referral to GYN as is higher risk due to nicotine use.  Have messaged referral coordinator to attempt to get imaging sooner.   ? ?  ?  ? Relevant Orders  ? US Pelvic Complete With Transvaginal  ? T4, free  ? TSH  ? Comprehensive metabolic panel  ? CBC with Differential/Platelet  ? FSH/LH  ? Ambulatory referral to Gynecology  ?  ? ?Follow up plan: ?Return for as scheduled 08/06/21 with Santiago Glad. ? ? ? ? ? ?

## 2021-06-18 NOTE — Telephone Encounter (Signed)
Summary: Advice with Menstrucal Cycle starting after 18 months  ? Pt is calling for advice Pt had Surgery on Friday For Atypical ductal hyperplasia right breast. Pt has not cycle in 18 months. Pt started with Heavy bleeding on Friday. Pt is calling for advice   ?  ? ?Reason for Disposition ? MODERATE vaginal bleeding (e.g., soaking 1 pad or tampon per hour and present > 6 hours; 1 menstrual cup every 6 hours) ? ?Answer Assessment - Initial Assessment Questions ?1. AMOUNT: "Describe the bleeding that you are having." "How much bleeding is there?"  ?  - SPOTTING: spotting, or pinkish / brownish mucous discharge; does not fill panty liner or pad  ?  - MILD:  less than 1 pad / hour; less than patient's usual menstrual bleeding ?  - MODERATE: 1-2 pads / hour; 1 menstrual cup every 6 hours; small-medium blood clots (e.g., pea, grape, small coin) ?  - SEVERE: soaking 2 or more pads/hour for 2 or more hours; 1 menstrual cup every 2 hours; bleeding not contained by pads or continuous red blood from vagina; large blood clots (e.g., golf ball, large coin)  ?    "Like a period" Not bad. No clots. ?2. ONSET: "When did the bleeding begin?" "Is it continuing now?" ?    Friday after surgery ?3. MENOPAUSE: "When was your last menstrual period?"  ?    18 months ago ?4. ABDOMINAL PAIN: "Do you have any pain?" "How bad is the pain?"  (e.g., Scale 1-10; mild, moderate, or severe) ?  - MILD (1-3): doesn't interfere with normal activities, abdomen soft and not tender to touch  ?  - MODERATE (4-7): interferes with normal activities or awakens from sleep, abdomen tender to touch  ?  - SEVERE (8-10): excruciating pain, doubled over, unable to do any normal activities  ?    Mild Cramping ?5. BLOOD THINNERS: "Do you take any blood thinners?" (e.g., Coumadin/warfarin, Pradaxa/dabigatran, aspirin) ?    No ?6. HORMONES: "Are you taking any hormone medications, prescription or OTC?" (e.g., birth control pills, estrogen) ?    No ?7. CAUSE: "What do  you think is causing the bleeding?" (e.g., recent gyn surgery, recent gyn procedure; known bleeding disorder, uterine cancer)   ?    "Maybe surgery?" ?8. HEMODYNAMIC STATUS: "Are you weak or feeling lightheaded?" If Yes, ask: "Can you stand and walk normally?"   ?    No ?9. OTHER SYMPTOMS: "What other symptoms are you having with the bleeding?" (e.g., back pain, burning with urination, fever) ?    No ? ?Protocols used: Vaginal Bleeding - Postmenopausal-A-AH ? ?

## 2021-06-18 NOTE — Telephone Encounter (Signed)
Refilled 06/04/2021 #60 1 refill. ?Requested Prescriptions  ?Pending Prescriptions Disp Refills  ?? varenicline (CHANTIX) 1 MG tablet [Pharmacy Med Name: VARENICLINE 1 MG TABLET] 60 tablet 1  ?  Sig: TAKE 1 TABLET BY MOUTH TWICE A DAY  ?  ? Psychiatry:  Drug Dependence Therapy - varenicline Failed - 06/17/2021  1:30 PM  ?  ?  Failed - Manual Review: Do not refill starter pack. 1 mg tabs may be extended up to one year if the patient has quit smoking but still feels at risk for relapse.  ?  ?  Passed - Cr in normal range and within 180 days  ?  Creatinine  ?Date Value Ref Range Status  ?02/08/2013 0.66 0.60 - 1.30 mg/dL Final  ? ?Creatinine, Ser  ?Date Value Ref Range Status  ?04/26/2021 0.70 0.57 - 1.00 mg/dL Final  ?   ?  ?  Passed - Completed PHQ-2 or PHQ-9 in the last 360 days  ?  ?  Passed - Valid encounter within last 6 months  ?  Recent Outpatient Visits   ?      ? Today Postmenopausal vaginal bleeding  ? [facility] Valders, Santel T, NP  ? 2 weeks ago Cigarette smoker  ? [facility] Larae Grooms, NP  ? 1 month ago Annual physical exam  ? [facility] Larae Grooms, NP  ? 6 months ago Cigarette smoker  ? [facility] Larae Grooms, NP  ? 7 months ago Need for influenza vaccination  ? [facility] Larae Grooms, NP  ?  ?  ?Future Appointments   ?        ? In 1 month Larae Grooms, NP [facility]  ? In 4 months Larae Grooms, NP [facility], [facility]  ?  ? ?  ?  ?  ? ?

## 2021-06-18 NOTE — Telephone Encounter (Signed)
?  Chief Complaint: Post Menopausal bleeding ?Symptoms: Vaginal bleeding since Friday S/P  BREAST BIOPSY WITH RADIO FREQUENCY LOCALIZER. States post menopausal x 18 months. Mild cramping. Mild-moderate bleeding "Like a period."  ?Frequency: Friday,  after surgery ?Pertinent Negatives: Patient denies clots, fever, dizziness, no blood thinners ?Disposition: [] ED /[] Urgent Care (no appt availability in office) / [x] Appointment(In office/virtual)/ []  [facility]/ [] Home Care/ [] Refused Recommended Disposition /[] [facility]/ []  Follow-up with PCP ?Additional Notes: Pt states spoke with surgical nurse this AM, Unsure if related to Bx. " Maybe hormones?"  Advised see PCP. Pt does not have OB/GYN. Appt secured for today. Care advise provided, pt verbalizes understanding.  ?

## 2021-06-18 NOTE — Assessment & Plan Note (Signed)
Started on 05/15/21 after breast biopsy, ?correlation.  LMP was October 2021, no cycles since that time.  Obtain labs today TSH, CBC, CMP, FSH/LH.  Will get imaging with pelvic and transvaginal ultrasound to further assess and urgent referral to GYN as is higher risk due to nicotine use.  Have messaged referral coordinator to attempt to get imaging sooner.   ?

## 2021-06-18 NOTE — Patient Instructions (Signed)
Postmenopausal Bleeding Postmenopausal bleeding is any bleeding that occurs after menopause. Menopause is a time in a woman's life when monthly periods stop. Any type of bleeding after menopause should be checked by your doctor. Treatment will depend on the cause. This kind of bleeding can be caused by: Taking hormones during menopause. Low or high amounts of female hormones in the body. This can cause the lining of the womb (uterus) to become too thin or too thick. Cancer. Growths in the womb that are not cancer. Follow these instructions at home:  Watch for any changes in your symptoms. Let your doctor know about them. Avoid using tampons and douches as told by your doctor. Change your pads regularly. Get regular pelvic exams. This includes Pap tests. Take iron pills as told by your doctor. Take over-the-counter and prescription medicines only as told by your doctor. Keep all follow-up visits. Contact a doctor if: You have new bleeding from the vagina after menopause. You have pain in your belly (abdomen). Get help right away if: You have a fever or chills. You have very bad pain with bleeding. You have clumps of blood (blood clots) coming from your vagina. You have a lot of bleeding, and: You use more than 1 pad an hour. This kind of bleeding has never happened before. You have headaches. You feel dizzy or you feel like you are going to pass out (faint). Summary Any type of bleeding after menopause should be checked by your doctor. Avoid using tampons or douches. Get regular pelvic exams. This includes Pap tests. Contact a doctor if you have new bleeding or pain in your belly. Watch for any changes in your symptoms. Let your doctor know about them. This information is not intended to replace advice given to you by your health care provider. Make sure you discuss any questions you have with your health care provider. Document Revised: 07/22/2019 Document Reviewed:  07/22/2019 Elsevier Patient Education  2023 Elsevier Inc.  

## 2021-06-19 LAB — CBC WITH DIFFERENTIAL/PLATELET
Basophils Absolute: 0.1 10*3/uL (ref 0.0–0.2)
Basos: 1 %
EOS (ABSOLUTE): 0.2 10*3/uL (ref 0.0–0.4)
Eos: 2 %
Hematocrit: 40.1 % (ref 34.0–46.6)
Hemoglobin: 13.4 g/dL (ref 11.1–15.9)
Immature Grans (Abs): 0 10*3/uL (ref 0.0–0.1)
Immature Granulocytes: 0 %
Lymphocytes Absolute: 3 10*3/uL (ref 0.7–3.1)
Lymphs: 33 %
MCH: 30.9 pg (ref 26.6–33.0)
MCHC: 33.4 g/dL (ref 31.5–35.7)
MCV: 92 fL (ref 79–97)
Monocytes Absolute: 0.5 10*3/uL (ref 0.1–0.9)
Monocytes: 5 %
Neutrophils Absolute: 5.4 10*3/uL (ref 1.4–7.0)
Neutrophils: 59 %
Platelets: 215 10*3/uL (ref 150–450)
RBC: 4.34 x10E6/uL (ref 3.77–5.28)
RDW: 12.5 % (ref 11.7–15.4)
WBC: 9.1 10*3/uL (ref 3.4–10.8)

## 2021-06-19 LAB — COMPREHENSIVE METABOLIC PANEL
ALT: 19 IU/L (ref 0–32)
AST: 21 IU/L (ref 0–40)
Albumin/Globulin Ratio: 1.8 (ref 1.2–2.2)
Albumin: 4 g/dL (ref 3.8–4.9)
Alkaline Phosphatase: 73 IU/L (ref 44–121)
BUN/Creatinine Ratio: 30 — ABNORMAL HIGH (ref 9–23)
BUN: 22 mg/dL (ref 6–24)
Bilirubin Total: <0.2 mg/dL (ref 0.0–1.2)
CO2: 24 mmol/L (ref 20–29)
Calcium: 9.1 mg/dL (ref 8.7–10.2)
Chloride: 101 mmol/L (ref 96–106)
Creatinine, Ser: 0.74 mg/dL (ref 0.57–1.00)
Globulin, Total: 2.2 g/dL (ref 1.5–4.5)
Glucose: 86 mg/dL (ref 70–99)
Potassium: 4.1 mmol/L (ref 3.5–5.2)
Sodium: 139 mmol/L (ref 134–144)
Total Protein: 6.2 g/dL (ref 6.0–8.5)
eGFR: 98 mL/min/{1.73_m2} (ref >59)

## 2021-06-19 LAB — T4, FREE: Free T4: 1.23 ng/dL (ref 0.82–1.77)

## 2021-06-19 LAB — FSH/LH
FSH: 42.8 m[IU]/mL
LH: 24.5 m[IU]/mL

## 2021-06-19 LAB — TSH: TSH: 2.26 u[IU]/mL (ref 0.450–4.500)

## 2021-06-19 NOTE — Progress Notes (Signed)
Contacted via [facility] ? ? ?Good evening Megan Mendoza, your labs have returned and all are stable. Hormone levels are postmenopausal.  We will see what ultrasound shows on 06/21/21, I see you are scheduled.  Have you heard from gynecology?  If not let me know and I will check on referral, as would like you seen as soon as possible.  Any questions? ?Keep being amazing!!  Thank you for allowing me to participate in your care.  I appreciate you. ?Kindest regards, ?Shenaya Lebo ?

## 2021-06-20 ENCOUNTER — Encounter: Payer: Self-pay | Admitting: Nurse Practitioner

## 2021-06-21 ENCOUNTER — Ambulatory Visit
Admission: RE | Admit: 2021-06-21 | Discharge: 2021-06-21 | Disposition: A | Discharge status: Home / Self Care | Payer: BC Managed Care – PPO | Source: Ambulatory Visit | Attending: Nurse Practitioner | Admitting: Nurse Practitioner

## 2021-06-21 DIAGNOSIS — N950 Postmenopausal bleeding: Secondary | ICD-10-CM | POA: Insufficient documentation

## 2021-06-21 DIAGNOSIS — N858 Other specified noninflammatory disorders of uterus: Secondary | ICD-10-CM | POA: Diagnosis not present

## 2021-06-21 DIAGNOSIS — N83202 Unspecified ovarian cyst, left side: Secondary | ICD-10-CM | POA: Diagnosis not present

## 2021-06-22 NOTE — Progress Notes (Signed)
Contacted via MyChart  ? ?Good evening Megan Mendoza, your ultrasound has returned.  Good news is that the endometrium (lining) of uterus is normal thickness.  We like this less then 4 mm, if greater then 4 mm they will often biopsy.  Your thickness was 3.3 mm more consistent with a non cancerous level.  There was a cyst to left adnexal (ovary area) which is suspected to be benign, but they do recommend repeat ultrasound in 6-12 weeks.  I see you are scheduled to see GYN in a couple weeks.  Wonderful.  They will further discuss next steps if bleeding still present.  Any questions? ?Keep being stellar!!  Thank you for allowing me to participate in your care.  I appreciate you. ?Kindest regards, ?Kenedy Haisley ?

## 2021-06-28 ENCOUNTER — Encounter: Payer: Self-pay | Admitting: Surgery

## 2021-06-28 ENCOUNTER — Other Ambulatory Visit: Payer: Self-pay

## 2021-06-28 ENCOUNTER — Ambulatory Visit (INDEPENDENT_AMBULATORY_CARE_PROVIDER_SITE_OTHER): Payer: BC Managed Care – PPO | Admitting: Surgery

## 2021-06-28 VITALS — BP 113/76 | HR 65 | Temp 98.3°F | Ht 65.0 in | Wt 191.6 lb

## 2021-06-28 DIAGNOSIS — N6091 Unspecified benign mammary dysplasia of right breast: Secondary | ICD-10-CM

## 2021-06-28 NOTE — Patient Instructions (Addendum)
We will contact you in November 2023 to schedule your Mammogram and follow up appointment with Dr. Claudine Mouton. If you do not hear from [place] please call the office .  ? ?Referral sent to medical Oncology. Someone will contact you to schedule an appointment. ? ? ?GENERAL POST-OPERATIVE ?PATIENT INSTRUCTIONS  ? ?WOUND CARE INSTRUCTIONS:  Keep a dry clean dressing on the wound if there is drainage. The initial bandage may be removed after 24 hours.  Once the wound has quit draining you may leave it open to air.  If clothing rubs against the wound or causes irritation and the wound is not draining you may cover it with a dry dressing during the daytime.  Try to keep the wound dry and avoid ointments on the wound unless directed to do so.  If the wound becomes bright red and painful or starts to drain infected material that is not clear, please contact your physician immediately.  If the wound is mildly pink and has a thick firm ridge underneath it, this is normal, and is referred to as a healing ridge.  This will resolve over the next 4-6 weeks. ? ?BATHING: ?You may shower if you have been informed of this by your surgeon. However, Please do not submerge in a tub, hot tub, or pool until incisions are completely sealed or have been told by your surgeon that you may do so. ? ?DIET:  You may eat any foods that you can tolerate.  It is a good idea to eat a high fiber diet and take in plenty of fluids to prevent constipation.  If you do become constipated you may want to take a mild laxative or take ducolax tablets on a daily basis until your bowel habits are regular.  Constipation can be very uncomfortable, along with straining, after recent surgery. ? ?ACTIVITY:  You are encouraged to cough and deep breath or use your incentive spirometer if you were given one, every 15-30 minutes when awake.  This will help prevent respiratory complications and low grade fevers post-operatively if you had a general anesthetic.  You may want  to hug a pillow when coughing and sneezing to add additional support to the surgical area, if you had abdominal or chest surgery, which will decrease pain during these times.  You are encouraged to walk and engage in light activity for the next two weeks.  You should not lift more than 20 pounds for 6 weeks total after surgery as it could put you at increased risk for complications.  Twenty pounds is roughly equivalent to a plastic bag of groceries. At that time- Listen to your body when lifting, if you have pain when lifting, stop and then try again in a few days. Soreness after doing exercises or activities of daily living is normal as you get back in to your normal routine. ? ?MEDICATIONS:  Try to take narcotic medications and anti-inflammatory medications, such as tylenol, ibuprofen, naprosyn, etc., with food.  This will minimize stomach upset from the medication.  Should you develop nausea and vomiting from the pain medication, or develop a rash, please discontinue the medication and contact your physician.  You should not drive, make important decisions, or operate machinery when taking narcotic pain medication. ? ?SUNBLOCK ?Use sun block to incision area over the next year if this area will be exposed to sun. This helps decrease scarring and will allow you avoid a permanent darkened area over your incision. ? ?QUESTIONS:  Please feel free to call  our office if you have any questions, and we will be glad to assist you. [phone] ? ? ?

## 2021-06-28 NOTE — Progress Notes (Signed)
[facility] ?POST-OP OFFICE VISIT ? ?06/28/2021 ? ?HPI: ?Megan Mendoza is a 51 y.o. female 13 days s/p RFID tag right breast lumpectomy/excisional biopsy.  Pathology was good, no DCIS, no malignancy.  And apparently no residual ADH. ? ?Vital signs: ?BP 113/76   Pulse 65   Temp 98.3 ?F (36.8 ?C) (Oral)   Ht 5\' 5"  (1.651 m)   Wt 191 lb 9.6 oz (86.9 kg)   LMP 04/28/2018 Comment: not preg  SpO2 96%   BMI 31.88 kg/m?   ? ?Physical Exam: ?Constitutional: She appears great. ? ?Skin: Right upper outer quadrant breast scar appears to be healing nicely.  06/28/2018 present as chaperone. ? ?Assessment/Plan: ?This is a 51 y.o. female 13 days s/p right breast excisional biopsy for ADH.  No residual ADH, no upstaging in diagnosis. ? ?Patient Active Problem List  ? Diagnosis Date Noted  ? Postmenopausal vaginal bleeding 06/18/2021  ? 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 Migraines 01/07/2020  ? Cigarette smoker 10/13/2019  ? Insomnia   ? Depression 04/07/2019  ? ? -Referral to oncology to discuss risk reduction strategies.  Follow-up with diagnostic right breast mammogram in 6 months.  We will be glad to see this patient back for clinical exam and discuss her risk reduction. ? ? ?04/09/2019 M.D., FACS ?06/28/2021, 9:15 AM  ?

## 2021-07-01 NOTE — Progress Notes (Signed)
[facility]  [phone] [phone]  ID: Megan Mendoza OB: [birth year]  MR#: [id number]  YBO#:[id number]  Patient Care Team: Megan Grooms, NP as PCP - General Megan Mendoza Megan Pizza, MD as Consulting Physician (Oncology)  CHIEF COMPLAINT: Atypical ductal hyperplasia of right breast.  INTERVAL HISTORY: Patient is a 51 year old female who was noted to have an abnormality on routine screening mammogram.  Subsequent biopsy and lumpectomy did not reveal any invasive malignancy or DCIS, but ADH.  She is referred for further evaluation.  She currently feels well and is asymptomatic.  She has no neurologic complaints.  She denies any recent fevers or illnesses.  She has a good appetite and denies weight loss.  She has no chest pain, shortness of breath, cough, or hemoptysis.  She denies any nausea, vomiting, constipation, or diarrhea.  She has no urinary complaints.  Patient feels at her baseline and offers no specific complaints today.  REVIEW OF SYSTEMS:   Review of Systems  Constitutional: Negative.  Negative for fever, malaise/fatigue and weight loss.  Respiratory: Negative.  Negative for cough, hemoptysis and shortness of breath.   Cardiovascular: Negative.  Negative for chest pain and leg swelling.  Gastrointestinal: Negative.  Negative for abdominal pain.  Genitourinary: Negative.  Negative for dysuria.  Musculoskeletal: Negative.  Negative for back pain.  Skin: Negative.  Negative for rash.  Neurological: Negative.  Negative for dizziness, focal weakness, weakness and headaches.  Psychiatric/Behavioral: Negative.  The patient is not nervous/anxious.    As per HPI. Otherwise, a complete review of systems is negative.  PAST MEDICAL HISTORY: Past Medical History:  Diagnosis Date   Complication of anesthesia    Insomnia    Perimenopausal    PONV (postoperative nausea and vomiting)     PAST SURGICAL HISTORY: Past Surgical History:   Procedure Laterality Date   BREAST BIOPSY Right 05/09/2021   right stereo bx ribbon clip ATYPICAL DUCTAL HYPERPLASIA, INVOLVING COLUMNARCELL LESION, WITH ASSOCIATED COARSE CALCIFICATIONS - BACKGROUNDMAMMARY PARENCHYMA DISPLAYING FIBROCYSTIC AND APOCRINE CHANGES, ANDFOCAL PSEUDOANGIOMATOUS STROMAL HYPERPLASIA (PASH) - NEGATIVE FORCARCINOMA IN SITU AND INVASIVE MAMMARY CARCINOMA of the RIGHT   BREAST BIOPSY WITH RADIO FREQUENCY LOCALIZER Right 06/15/2021   Procedure: BREAST BIOPSY WITH RADIO FREQUENCY LOCALIZER;  Surgeon: Megan Lerner, MD;  Location: [facility] ORS;  Service: General;  Laterality: Right;   BREAST LUMPECTOMY Right    BREAST LUMPECTOMY WITH RADIOFREQUENCY TAG IDENTIFICATION Right 05/24/2021   CESAREAN SECTION     2   TUBAL LIGATION      FAMILY HISTORY: Family History  Problem Relation Age of Onset   Heart disease Mother    Lung cancer Father    Asthma Son    ADD / ADHD Son    Breast cancer Neg Hx     ADVANCED DIRECTIVES (Y/N):  N  HEALTH MAINTENANCE: Social History   Tobacco Use   Smoking status: Former    Types: Cigarettes   Smokeless tobacco: Never   Tobacco comments:    pt states she stopped smoking 4 days ago  Vaping Use   Vaping Use: Never used  Substance Use Topics   Alcohol use: Yes    Comment: ocassionally   Drug use: Never     Colonoscopy:  PAP:  Bone density:  Lipid panel:  Allergies  Allergen Reactions   Aspirin Nausea And Vomiting    Current Outpatient Medications  Medication Sig Dispense Refill   ASHWAGANDHA PO Take by mouth.     escitalopram (LEXAPRO) 5 MG tablet Take  1 tab by mouth daily. 90 tablet 1   Melatonin 10 MG TABS Take 10 mg by mouth as needed.     traZODone (DESYREL) 100 MG tablet Take 1 tablet (100 mg total) by mouth at bedtime. TAKE 1 TABLET BY MOUTH AT BEDTIME AS NEEDED FOR SLEEP 90 tablet 1   varenicline (CHANTIX CONTINUING MONTH PAK) 1 MG tablet Take 1 tablet (1 mg total) by mouth 2 (two) times daily. 60 tablet 1    No current facility-administered medications for this visit.    OBJECTIVE: Vitals:   07/04/21 1100  BP: 121/85  Pulse: 65  Resp: 16  Temp: (!) 97.1 F (36.2 C)  SpO2: 99%     Body mass index is 31.78 kg/m.    ECOG FS:0 - Asymptomatic  General: Well-developed, well-nourished, no acute distress. Eyes: Pink conjunctiva, anicteric sclera. HEENT: Normocephalic, moist mucous membranes. Breast: Exam deferred today. Lungs: No audible wheezing or coughing. Heart: Regular rate and rhythm. Abdomen: Soft, nontender, no obvious distention. Musculoskeletal: No edema, cyanosis, or clubbing. Neuro: Alert, answering all questions appropriately. Cranial nerves grossly intact. Skin: No rashes or petechiae noted. Psych: Normal affect. Lymphatics: No cervical, calvicular, axillary or inguinal LAD.   LAB RESULTS:  Lab Results  Component Value Date   NA 139 06/18/2021   K 4.1 06/18/2021   CL 101 06/18/2021   CO2 24 06/18/2021   GLUCOSE 86 06/18/2021   BUN 22 06/18/2021   CREATININE 0.74 06/18/2021   CALCIUM 9.1 06/18/2021   PROT 6.2 06/18/2021   ALBUMIN 4.0 06/18/2021   AST 21 06/18/2021   ALT 19 06/18/2021   ALKPHOS 73 06/18/2021   BILITOT <0.2 06/18/2021   GFRNONAA 71 04/07/2019   GFRAA 81 04/07/2019    Lab Results  Component Value Date   WBC 9.1 06/18/2021   NEUTROABS 5.4 06/18/2021   HGB 13.4 06/18/2021   HCT 40.1 06/18/2021   MCV 92 06/18/2021   PLT 215 06/18/2021     STUDIES: MM Breast Surgical Specimen  Result Date: 06/15/2021 CLINICAL DATA:  Biopsy-proven atypical ductal hyperplasia involving the RIGHT breast for which the patient underwent radiofrequency device localization prior to today's excisional biopsy. EXAM: SPECIMEN RADIOGRAPH OF THE RIGHT BREAST COMPARISON:  Previous exam(s). FINDINGS: Status post excision of the RIGHT breast. The radiofrequency device and the ribbon shaped tissue marking clip are present within the specimen. The image was submitted for  interpretation post-operatively. IMPRESSION: Specimen radiograph of the RIGHT breast. Electronically Signed   By: Hulan Saashomas  Lawrence M.D.   On: 06/15/2021 11:45  US Pelvic Complete With Transvaginal  Result Date: 06/21/2021 CLINICAL DATA:  Post menopausal bleeding EXAM: TRANSABDOMINAL AND TRANSVAGINAL ULTRASOUND OF PELVIS TECHNIQUE: Both transabdominal and transvaginal ultrasound examinations of the pelvis were performed. Transabdominal technique was performed for global imaging of the pelvis including uterus, ovaries, adnexal regions, and pelvic cul-de-sac. It was necessary to proceed with endovaginal exam following the transabdominal exam to visualize the uterus endometrium ovaries. COMPARISON:  None Available. FINDINGS: Uterus Measurements: 8.3 x 2.9 x 3.7 cm = volume: 47.2 mL. Scattered calcifications likely due to previous infection or inflammation. Multiple tiny cysts most evident at the fundus. Endometrium Thickness: 3.3 mm.  No focal abnormality visualized. Right ovary Not seen Left ovary Measurements: 2.5 x 1.6 x 1.5 cm = volume: 3.1 mL. Indeterminate but probably benign cyst in the left ovary measuring 13 x 12 x 9 mm. Other findings No abnormal free fluid. IMPRESSION: 1. Endometrial thickness of 3.3 mm. In the setting of post-menopausal  bleeding, this is consistent with a benign etiology such as endometrial atrophy. If bleeding remains unresponsive to hormonal or medical therapy, sonohysterogram should be considered for focal lesion work-up. (Ref: Radiological Reasoning: Algorithmic Workup of Abnormal Vaginal Bleeding with Endovaginal Sonography and Sonohysterography. AJR 2008; [id number]) 2. Multiple tiny cysts at the endomyometrial junction in the fundus, question an area of adenomyosis 3. Indeterminate but probably benign left adnexal cyst measuring 13 mm. Recommend 6-12 week sonographic follow-up. Electronically Signed   By: Jasmine Pang M.D.   On: 06/21/2021 18:28    ASSESSMENT: Atypical ductal  hyperplasia of right breast.  PLAN:    Atypical ductal hyperplasia of right breast: No invasive component or DCIS was noted in the pathology sample.  Patient also had clear margins.  No further intervention is needed.  We discussed possible prophylaxis with tamoxifen, but patient declined.  She expressed understanding that a diagnosis of ADH puts her at a 3-5x increased risk over the general population for developing invasive carcinoma.  Use of breast MRI in the situation is unclear.  Patient agreed that her best course of action is to remain vigilant with her yearly mammograms.  She does not require genetic referral at this time.  No further intervention is needed.  No further follow-up has been scheduled.  Mammograms can be continued as ordered by patient's primary care.  I spent a total of 45 minutes reviewing chart data, face-to-face evaluation with the patient, counseling and coordination of care as detailed above.   Patient expressed understanding and was in agreement with this plan. She also understands that She can call clinic at any time with any questions, concerns, or complaints.    Cancer Staging  No matching staging information was found for the patient.  Jeralyn Ruths, MD   07/06/2021 2:43 PM

## 2021-07-04 ENCOUNTER — Inpatient Hospital Stay: Payer: BC Managed Care – PPO | Attending: Oncology | Admitting: Oncology

## 2021-07-04 ENCOUNTER — Inpatient Hospital Stay: Payer: BC Managed Care – PPO

## 2021-07-04 ENCOUNTER — Encounter: Payer: Self-pay | Admitting: Oncology

## 2021-07-04 VITALS — BP 121/85 | HR 65 | Temp 97.1°F | Resp 16 | Ht 65.0 in | Wt 191.0 lb

## 2021-07-04 DIAGNOSIS — N6091 Unspecified benign mammary dysplasia of right breast: Secondary | ICD-10-CM | POA: Diagnosis not present

## 2021-07-04 DIAGNOSIS — Z79899 Other long term (current) drug therapy: Secondary | ICD-10-CM | POA: Insufficient documentation

## 2021-07-04 DIAGNOSIS — Z801 Family history of malignant neoplasm of trachea, bronchus and lung: Secondary | ICD-10-CM | POA: Insufficient documentation

## 2021-07-04 DIAGNOSIS — Z87891 Personal history of nicotine dependence: Secondary | ICD-10-CM | POA: Diagnosis not present

## 2021-07-06 ENCOUNTER — Encounter: Payer: Self-pay | Admitting: Family Medicine

## 2021-07-06 ENCOUNTER — Ambulatory Visit (INDEPENDENT_AMBULATORY_CARE_PROVIDER_SITE_OTHER): Payer: BC Managed Care – PPO | Admitting: Family Medicine

## 2021-07-06 VITALS — BP 100/70 | Ht 65.0 in | Wt 192.0 lb

## 2021-07-06 DIAGNOSIS — N950 Postmenopausal bleeding: Secondary | ICD-10-CM

## 2021-07-06 NOTE — Progress Notes (Signed)
GYNECOLOGY PROBLEM  VISIT ENCOUNTER NOTE  Subjective:   Megan Mendoza is a 51 y.o. [phone] female here for a problem GYN visit.    Current complaints:  Chief Complaint  Patient presents with   Vaginal Bleeding    Had a full cycle of 5 days, regular flow, no abnormal pain. Had an U/S on 06/21/21. Last cycle Oct 2021   Reports new onset of vaginal bleeding for 5 days (4/28 started). Last cycle was in October 2021.   Bleeding started after recent breast surgery. She had lumpectomy performed-- pathology showed focal ductal hyperplasia. She reports a history of ovarian cysts and endometriosis. She has had abnormal paps in her distant past with cervical biopsy at that time. Last pap was in 2021 and was NIL with negative HPV.   Denies abnormal vaginal bleeding, discharge, pelvic pain, problems with intercourse or other gynecologic concerns.     [place] on 5/4 IMPRESSION: 1. Endometrial thickness of 3.3 mm. In the setting of post-menopausal bleeding, this is consistent with a benign etiology such as endometrial atrophy. If bleeding remains unresponsive to hormonal or medical therapy, sonohysterogram should be considered for focal lesion work-up. (Ref: Radiological Reasoning: Algorithmic Workup of Abnormal Vaginal Bleeding with Endovaginal Sonography and Sonohysterography. AJR 2008; [id number]) 2. Multiple tiny cysts at the endomyometrial junction in the fundus, question an area of adenomyosis 3. Indeterminate but probably benign left adnexal cyst measuring 13 mm. Recommend 6-12 week sonographic follow-up.   Gynecologic History Patient's last menstrual period was 04/28/2018.  Contraception: post menopausal status  Health Maintenance Due  Topic Date Due   COVID-19 Vaccine (3 - Booster for Pfizer series) 08/25/2019    The following portions of the patient's history were reviewed and updated as appropriate: allergies, current medications, past family history, past medical history,  past social history, past surgical history and problem list.  Review of Systems Pertinent items are noted in HPI.   Objective:  BP 100/70   Ht 5\' 5"  (1.651 m)   Wt 192 lb (87.1 kg)   LMP 04/28/2018 Comment: not preg  BMI 31.95 kg/m  Gen: well appearing, NAD HEENT: no scleral icterus CV: RR Lung: Normal WOB Ext: warm well perfused GU: declined today  Assessment and Plan:  1. Postmenopausal vaginal bleeding Counseled on work up which usually includes [place] and endometrial sampling. Patient wants to discuss "having it all taken out"  I reviewed that [place] results favor a benign cause of bleeding but specifically discussed that I cannot rule out uterine cancer without tissue sampling. Patient declined endometrial sampling today I reviewed a plan to discuss possible hysterectomy with GYN surgeon. Specifically discussed that hysterectomy surgery is not benign and comes with risks. Reviewed GYN surgeon might also recommend endometrial sampling prior to any hysterectomy.   Face to face time:  23 minutes  Greater than 50% of the visit time was spent in counseling and coordination of care with the patient.  The summary and outline of the counseling and care coordination is summarized in the note above.   All questions were answered.  Please refer to After Visit Summary for other counseling recommendations.   Return in about 2 weeks (around 07/20/2021) for Virtual visit to discuss hysterectomy (with Crawford or Constant).  Future Appointments  Date Time Provider Gregory  07/19/2021 10:55 AM Constant, Vickii Chafe, MD [facility] None  07/25/2021  3:00 PM [facility] None  08/06/2021 10:20 AM Jon Billings, NP [facility]  10/30/2021  8:00 AM Jon Billings, NP  [facility]    Caren Macadam, MD, MPH, ABFM Attending [place] for [facility]

## 2021-07-15 ENCOUNTER — Other Ambulatory Visit: Payer: Self-pay | Admitting: Nurse Practitioner

## 2021-07-17 NOTE — Telephone Encounter (Signed)
Requested Prescriptions  Pending Prescriptions Disp Refills  . varenicline (CHANTIX) 1 MG tablet [Pharmacy Med Name: VARENICLINE 1 MG TABLET] 60 tablet 1    Sig: TAKE 1 TABLET BY MOUTH TWICE A DAY     Psychiatry:  Drug Dependence Therapy - varenicline Failed - 07/15/2021  2:31 PM      Failed - Manual Review: Do not refill starter pack. 1 mg tabs may be extended up to one year if the patient has quit smoking but still feels at risk for relapse.      Passed - Cr in normal range and within 180 days    Creatinine  Date Value Ref Range Status  02/08/2013 0.66 0.60 - 1.30 mg/dL Final   Creatinine, Ser  Date Value Ref Range Status  06/18/2021 0.74 0.57 - 1.00 mg/dL Final         Passed - Completed PHQ-2 or PHQ-9 in the last 360 days      Passed - Valid encounter within last 6 months    Recent Outpatient Visits          4 weeks ago Postmenopausal vaginal bleeding   [facility] [place], Corrie Dandy T, NP   1 month ago Cigarette smoker   [facility] Larae Grooms, NP   2 months ago Annual physical exam   [facility] Larae Grooms, NP   7 months ago Cigarette smoker   [facility] Larae Grooms, NP   8 months ago Need for influenza vaccination   [facility] Larae Grooms, NP      Future Appointments            In 2 days Constant, Gigi Gin, MD [facility]   In 2 weeks Larae Grooms, NP [facility], [facility]   In 3 months Larae Grooms, NP [facility], [facility]

## 2021-07-19 ENCOUNTER — Encounter: Payer: Self-pay | Admitting: Obstetrics and Gynecology

## 2021-07-19 ENCOUNTER — Telehealth (INDEPENDENT_AMBULATORY_CARE_PROVIDER_SITE_OTHER): Payer: BC Managed Care – PPO | Admitting: Obstetrics and Gynecology

## 2021-07-19 VITALS — Ht 65.0 in | Wt 190.0 lb

## 2021-07-19 DIAGNOSIS — N950 Postmenopausal bleeding: Secondary | ICD-10-CM

## 2021-07-19 NOTE — Progress Notes (Signed)
Patient presents via virtual visit to discuss post menopausal bleeding. States she had 5 days of bleeding after a lumpectomy, moderate bleeding and nothing since. Would like to discuss pro's and cons as well as alternatives. No other concerns.

## 2021-07-19 NOTE — Progress Notes (Signed)
Virtual Visit via Video Note  I connected with Megan Mendoza on 07/19/21 at  1:30 PM EDT by video and verified that I was speaking with the correct person using two identifiers.    Megan Mendoza is a 51 y.o. [phone] who LMP was Patient's last menstrual period was 04/28/2018. I discussed the limitations, risks, security and privacy concerns of performing an evaluation and management service by video and the availability of in person appointments. I also discussed with the patient that there may be a patient responsible charge related to this service. The patient expressed understanding and agreed to proceed.  Location of patient:  Work  Patient gave explicit verbal consent for video visit:  YES  Location of provider:  [facility] office  Persons other than physician and patient involved in provider conference:  None   Subjective:   History of Present Illness:    Patient had a 5-day history of postmenopausal bleeding after recent lumpectomy/breast surgery.  She had no bleeding prior to the for approximately a year and has had no bleeding since. She recently underwent an ultrasound and she presents today to discuss the findings ultrasound and possible management.  Hx: The following portions of the patient's history were reviewed and updated as appropriate:             She  has a past medical history of Complication of anesthesia, Insomnia, Perimenopausal, and PONV (postoperative nausea and vomiting). She does not have any pertinent problems on file. She  has a past surgical history that includes Cesarean section; Tubal ligation; Breast biopsy (Right, 05/09/2021); Breast lumpectomy (Right); Breast lumpectomy with radiofrequency tag identification (Right, 05/24/2021); and Breast biopsy with radio frequency localizer (Right, 06/15/2021). Her family history includes ADD / ADHD in her son; Asthma in her son; Heart disease in her mother; Lung cancer in her father. She  reports that she quit  smoking about 7 weeks ago. Her smoking use included cigarettes. She has never used smokeless tobacco. She reports that she does not currently use alcohol. She reports that she does not use drugs. She has a current medication list which includes the following prescription(s): ashwagandha, escitalopram, melatonin, trazodone, and varenicline. She is allergic to aspirin.       Review of Systems:  Review of Systems  Constitutional: Denied constitutional symptoms, night sweats, recent illness, fatigue, fever, insomnia and weight loss.  Eyes: Denied eye symptoms, eye pain, photophobia, vision change and visual disturbance.  Ears/Nose/Throat/Neck: Denied ear, nose, throat or neck symptoms, hearing loss, nasal discharge, sinus congestion and sore throat.  Cardiovascular: Denied cardiovascular symptoms, arrhythmia, chest pain/pressure, edema, exercise intolerance, orthopnea and palpitations.  Respiratory: Denied pulmonary symptoms, asthma, pleuritic pain, productive sputum, cough, dyspnea and wheezing.  Gastrointestinal: Denied, gastro-esophageal reflux, melena, nausea and vomiting.  Genitourinary: See HPI for additional information.  Musculoskeletal: Denied musculoskeletal symptoms, stiffness, swelling, muscle weakness and myalgia.  Dermatologic: Denied dermatology symptoms, rash and scar.  Neurologic: Denied neurology symptoms, dizziness, headache, neck pain and syncope.  Psychiatric: Denied psychiatric symptoms, anxiety and depression.  Endocrine: Denied endocrine symptoms including hot flashes and night sweats.   Meds:   Current Outpatient Medications on File Prior to Visit  Medication Sig Dispense Refill   ASHWAGANDHA PO Take by mouth.     escitalopram (LEXAPRO) 5 MG tablet Take 1 tab by mouth daily. 90 tablet 1   Melatonin 10 MG TABS Take 10 mg by mouth as needed.     traZODone (DESYREL) 100 MG tablet Take 1 tablet (  100 mg total) by mouth at bedtime. TAKE 1 TABLET BY MOUTH AT BEDTIME AS NEEDED  FOR SLEEP 90 tablet 1   varenicline (CHANTIX) 1 MG tablet TAKE 1 TABLET BY MOUTH TWICE A DAY 60 tablet 2   No current facility-administered medications on file prior to visit.    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 Postmenopausal vaginal bleeding     Single episode of postmenopausal bleeding after recent breast surgery   Ultrasound shows 3 mm endometrium   Small adnexal cyst-likely Gartner's duct cyst-benign appearance   Possible adenomyosis -menopausal adenomyosis should resolve without incident or at the very least not be a problem during menopause  Plan:            1.  We have discussed all the findings of her ultrasound in detail.  I have reassured her regarding all of these findings.  I do not believe she needs any further GYN work-up for this episode of postmenopausal bleeding.  I have informed her that if her postmenopausal bleeding returns a further work-up may be indicated.  Orders No orders of the defined types were placed in this encounter.   No orders of the defined types were placed in this encounter.     F/U  No follow-ups on file. I spent 23 minutes involved in the care of this patient preparing to see the patient by obtaining and reviewing her medical history (including labs, imaging tests and prior procedures), documenting clinical information in the electronic health record (EHR), counseling and coordinating care plans, writing and sending prescriptions, ordering tests or procedures and in direct communicating with the patient and medical staff discussing pertinent items from her history and physical exam.   Elonda Husky, M.D. 07/19/2021 2:39 PM

## 2021-07-25 ENCOUNTER — Inpatient Hospital Stay: Payer: BC Managed Care – PPO | Attending: Oncology | Admitting: Hospice and Palliative Medicine

## 2021-07-25 DIAGNOSIS — N6091 Unspecified benign mammary dysplasia of right breast: Secondary | ICD-10-CM

## 2021-07-25 NOTE — Progress Notes (Signed)
Multidisciplinary Oncology Council Documentation  Megan Mendoza was presented by our [facility] on 07/25/2021, which included representatives from:  Palliative Care Dietitian  Physical/Occupational Therapist Nurse Navigator Genetics Speech Therapist Social work Survivorship RN Neurosurgeon currently presents with history of breast cancer  We reviewed previous medical and familial history, history of present illness, and recent lab results along with all available histopathologic and imaging studies. The MOC considered available treatment options and made the following recommendations/referrals:  SW  The MOC is a meeting of clinicians from various specialty areas who evaluate and discuss patients for whom a multidisciplinary approach is being considered. Final determinations in the plan of care are those of the provider(s).   Today's extended care, comprehensive team conference, Megan Mendoza was not present for the discussion and was not examined.

## 2021-07-27 ENCOUNTER — Encounter: Payer: Self-pay | Admitting: Licensed Clinical Social Worker

## 2021-07-27 NOTE — Progress Notes (Signed)
[facility] Clinical Social Work  Clinical Social Work was referred by nurse navigator for assessment of psychosocial needs.  Clinical Social Worker attempted to contact patient by phone  to offer support and assess for needs.  CSW left voicemail with contact information and request for return call.   First Attempt  Bonne Whack, LCSW  Clinical Social Worker [facility]         

## 2021-08-06 ENCOUNTER — Ambulatory Visit: Payer: BC Managed Care – PPO | Admitting: Nurse Practitioner

## 2021-08-16 ENCOUNTER — Encounter: Payer: Self-pay | Admitting: Licensed Clinical Social Worker

## 2021-08-16 NOTE — Progress Notes (Signed)
[facility] Clinical Social Work  Clinical Social Work was referred by medical provider for assessment of psychosocial needs.  Clinical Social Worker attempted to contact patient by phone  to offer support and assess for needs.  CSW left voicemail with contact information and request for return call.   2nd attempt   Joseph Art, LCSW  Clinical Social Worker [facility]

## 2021-08-29 DIAGNOSIS — Z6832 Body mass index (BMI) 32.0-32.9, adult: Secondary | ICD-10-CM | POA: Diagnosis not present

## 2021-08-29 DIAGNOSIS — R635 Abnormal weight gain: Secondary | ICD-10-CM | POA: Diagnosis not present

## 2021-09-27 DIAGNOSIS — R635 Abnormal weight gain: Secondary | ICD-10-CM | POA: Diagnosis not present

## 2021-09-27 DIAGNOSIS — Z6832 Body mass index (BMI) 32.0-32.9, adult: Secondary | ICD-10-CM | POA: Diagnosis not present

## 2021-10-15 ENCOUNTER — Other Ambulatory Visit: Payer: Self-pay | Admitting: Nurse Practitioner

## 2021-10-16 NOTE — Telephone Encounter (Signed)
Requested Prescriptions  Pending Prescriptions Disp Refills  . escitalopram (LEXAPRO) 5 MG tablet [Pharmacy Med Name: ESCITALOPRAM 5 MG TABLET] 90 tablet 1    Sig: TAKE 1 TABLET BY MOUTH EVERY DAY     Psychiatry:  Antidepressants - SSRI Passed - 10/15/2021  2:30 AM      Passed - Completed PHQ-2 or PHQ-9 in the last 360 days      Passed - Valid encounter within last 6 months    Recent Outpatient Visits          4 months ago Postmenopausal vaginal bleeding   [facility] [place], Corrie Dandy T, NP   4 months ago Cigarette smoker   [facility] Larae Grooms, NP   5 months ago Annual physical exam   [facility] Larae Grooms, NP   10 months ago Cigarette smoker   [facility] Larae Grooms, NP   11 months ago Need for influenza vaccination   [facility] Larae Grooms, NP      Future Appointments            In 2 weeks Larae Grooms, NP [facility], [facility]

## 2021-10-22 ENCOUNTER — Other Ambulatory Visit: Payer: Self-pay | Admitting: Nurse Practitioner

## 2021-10-24 NOTE — Telephone Encounter (Signed)
Requested Prescriptions  Pending Prescriptions Disp Refills  . traZODone (DESYREL) 100 MG tablet [Pharmacy Med Name: TRAZODONE 100 MG TABLET] 90 tablet 0    Sig: TAKE 1 TABLET (100 MG) BY MOUTH AT BEDTIME. TAKE 1 TABLET BY MOUTH AT BEDTIME AS NEEDED FOR SLEEP     Psychiatry: Antidepressants - Serotonin Modulator Passed - 10/22/2021  2:33 AM      Passed - Completed PHQ-2 or PHQ-9 in the last 360 days      Passed - Valid encounter within last 6 months    Recent Outpatient Visits          4 months ago Postmenopausal vaginal bleeding   [facility] [place], Corrie Dandy T, NP   4 months ago Cigarette smoker   [facility] Larae Grooms, NP   6 months ago Annual physical exam   [facility] Larae Grooms, NP   11 months ago Cigarette smoker   [facility] Larae Grooms, NP   12 months ago Need for influenza vaccination   [facility] Larae Grooms, NP      Future Appointments            In 6 days Larae Grooms, NP [facility]

## 2021-10-30 ENCOUNTER — Ambulatory Visit: Payer: BC Managed Care – PPO | Admitting: Nurse Practitioner

## 2021-10-31 ENCOUNTER — Other Ambulatory Visit: Payer: Self-pay

## 2021-10-31 DIAGNOSIS — N6091 Unspecified benign mammary dysplasia of right breast: Secondary | ICD-10-CM

## 2021-11-13 DIAGNOSIS — R635 Abnormal weight gain: Secondary | ICD-10-CM | POA: Diagnosis not present

## 2021-11-13 DIAGNOSIS — Z6832 Body mass index (BMI) 32.0-32.9, adult: Secondary | ICD-10-CM | POA: Diagnosis not present

## 2021-11-13 DIAGNOSIS — E782 Mixed hyperlipidemia: Secondary | ICD-10-CM | POA: Diagnosis not present

## 2021-11-19 ENCOUNTER — Ambulatory Visit
Admission: RE | Admit: 2021-11-19 | Discharge: 2021-11-19 | Disposition: A | Discharge status: Home / Self Care | Payer: BC Managed Care – PPO | Source: Ambulatory Visit | Attending: Surgery | Admitting: Surgery

## 2021-11-19 DIAGNOSIS — R922 Inconclusive mammogram: Secondary | ICD-10-CM | POA: Diagnosis not present

## 2021-11-19 DIAGNOSIS — N6091 Unspecified benign mammary dysplasia of right breast: Secondary | ICD-10-CM | POA: Insufficient documentation

## 2021-11-26 NOTE — Progress Notes (Unsigned)
Surgical Clinic Progress/Follow-up Note   HPI:  51 y.o. Female presents to clinic for ADH of right breast s/p lumpectomy 05/2021 now presents for follow-up imaging.  Patient reports complete improvement/resolution of prior issues and denies any residual pain or tenderness.  She is happy with the healing of her upper outer right breast biopsy site.  Denies CP, or SOB.  Review of Systems:  Constitutional: denies fever/chills  ENT: denies sore throat, hearing problems  Respiratory: denies shortness of breath, wheezing  Cardiovascular: denies chest pain, palpitations  Gastrointestinal: denies abdominal pain, N/V, or diarrhea/and bowel function as per interval history Skin: Denies any other rashes or skin discolorations as per interval history  Vital Signs:  BP (!) 141/89   Pulse 78   Temp 98 F (36.7 C)   Ht 5\' 5"  (1.651 m)   Wt 191 lb (86.6 kg)   LMP 04/28/2018 Comment: not preg  SpO2 99%   BMI 31.78 kg/m    Physical Exam:  Constitutional:  -- Normal body habitus  -- Awake, alert, and oriented x3  Pulmonary:  -- Breathing non-labored at rest Cardiovascular:  -- S1, S2 present  -- No pericardial rubs  Gastrointestinal:  -- Soft and non-distended, non-tender GU  --Caryl Lyn present as chaperone.  Right axillary tail incision well-healed.  Neither breast with any dominant, nor suspicious masses or nodularity.  No skin changes, no obvious nipple discharge. -- Extremities: B/L UE and LE FROM, hands and feet warm, no edema    Imaging:  CLINICAL DATA:  Status post stereotactic guided biopsy in March 2023 calcifications which demonstrated ADH which subsequently underwent surgical excision.   EXAM: DIGITAL DIAGNOSTIC UNILATERAL RIGHT MAMMOGRAM WITH TOMOSYNTHESIS   TECHNIQUE: Right digital diagnostic mammography and breast tomosynthesis was performed.   COMPARISON:  Previous exam(s).   ACR Breast Density Category c: The breast tissue is heterogeneously dense, which may  obscure small masses.   FINDINGS: There is density and architectural distortion within the RIGHT breast, consistent with postsurgical changes. These are new in comparison to prior. No suspicious mass, distortion, or microcalcifications are identified to suggest presence of malignancy.   IMPRESSION: No mammographic evidence of malignancy.   RECOMMENDATION: 1. Recommend return to annual screening mammography, due February 2024. 2. Given elevated lifetime risk of breast cancer conferred by a diagnosis of atypical ductal hyperplasia, recommend initiation of annual high risk screening protocol with annual mammography and annual breast MRI with and without contrast. The American Cancer Society recommends annual MRI and mammography in patients with an estimated lifetime risk of developing breast cancer greater than 20 - 25%, or who are known or suspected to be positive for the breast cancer gene.   I have discussed the findings and recommendations with the patient. If applicable, a reminder letter will be sent to the patient regarding the next appointment.   BI-RADS CATEGORY  2: Benign.     Electronically Signed   By: March 2024 M.D.   On: 11/19/2021 13:29    Assessment:  51 y.o. yo Female with a problem list including...  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  Plan:   -Will initiate ordering screening breast MRI, attempting to distance MRI evaluation  with screening mammography evaluation.  She will have her next screening bilateral mammography in February 2024.  I believe breast MRI screening is warranted as she is clearly at high risk due to her  history of ADH, and mammographic screening, due to her breast density type C, is of insufficient confidence.             - return to clinic annually, or as needed, instructed to call office if any questions or concerns  All of the above recommendations were discussed with the patient and patient's family, and all of patient's questions were answered to her expressed satisfaction.  These notes generated with voice recognition software. I apologize for typographical errors.  Ronny Bacon, MD, FACS : [facility]. Office: [phone]

## 2021-11-27 ENCOUNTER — Ambulatory Visit (INDEPENDENT_AMBULATORY_CARE_PROVIDER_SITE_OTHER): Payer: BC Managed Care – PPO | Admitting: Surgery

## 2021-11-27 ENCOUNTER — Encounter: Payer: Self-pay | Admitting: Surgery

## 2021-11-27 VITALS — BP 141/89 | HR 78 | Temp 98.0°F | Ht 65.0 in | Wt 191.0 lb

## 2021-11-27 DIAGNOSIS — N6091 Unspecified benign mammary dysplasia of right breast: Secondary | ICD-10-CM

## 2021-11-27 NOTE — Patient Instructions (Addendum)
Patient will be asked to return to the office in February with a bilateral screening mammogram.  We will send you a letter about these appointments. We would like for you to have a Breast MRI, we will cal you about this.   Continue self breast exams. Call office for any new breast issues or concerns.   Breast Self-Awareness Breast self-awareness is knowing how your breasts look and feel. You need to: Check your breasts on a regular basis. Tell your doctor about any changes. Become familiar with the look and feel of your breasts. This can help you catch a breast problem while it is still small and can be treated. You should do breast self-exams even if you have breast implants. What you need: A mirror. A well-lit room. A pillow or other soft object. How to do a breast self-exam Follow these steps to do a breast self-exam: Look for changes  Take off all the clothes above your waist. Stand in front of a mirror in a room with good lighting. Put your hands down at your sides. Compare your breasts in the mirror. Look for any difference between them, such as: A difference in shape. A difference in size. Wrinkles, dips, and bumps in one breast and not the other. Look at each breast for changes in the skin, such as: Redness. Scaly areas. Skin that has gotten thicker. Dimpling. Open sores (ulcers). Look for changes in your nipples, such as: Fluid coming out of a nipple. Fluid around a nipple. Bleeding. Dimpling. Redness. A nipple that looks pushed in (retracted), or that has changed position. Feel for changes Lie on your back. Feel each breast. To do this: Pick a breast to feel. Place a pillow under the shoulder closest to that breast. Put the arm closest to that breast behind your head. Feel the nipple area of that breast using the hand of your other arm. Feel the area with the pads of your three middle fingers by making small circles with your fingers. Use light, medium, and firm  pressure. Continue the overlapping circles, moving downward over the breast. Keep making circles with your fingers. Stop when you feel your ribs. Start making circles with your fingers again, this time going upward until you reach your collarbone. Then, make circles outward across your breast and into your armpit area. Squeeze your nipple. Check for discharge and lumps. Repeat these steps to check your other breast. Sit or stand in the tub or shower. With soapy water on your skin, feel each breast the same way you did when you were lying down. Write down what you find Writing down what you find can help you remember what to tell your doctor. Write down: What is normal for each breast. Any changes you find in each breast. These include: The kind of changes you find. A tender or painful breast. Any lump you find. Write down its size and where it is. When you last had your monthly period (menstrual cycle). General tips If you are breastfeeding, the best time to check your breasts is after you feed your baby or after you use a breast pump. If you get monthly bleeding, the best time to check your breasts is 5-7 days after your monthly cycle ends. With time, you will become comfortable with the self-exam. You will also start to know if there are changes in your breasts. Contact a doctor if: You see a change in the shape or size of your breasts or nipples. You see a change  in the skin of your breast or nipples, such as red or scaly skin. You have fluid coming from your nipples that is not normal. You find a new lump or thick area. You have breast pain. You have any concerns about your breast health. Summary Breast self-awareness includes looking for changes in your breasts and feeling for changes within your breasts. You should do breast self-awareness in front of a mirror in a well-lit room. If you get monthly periods (menstrual cycles), the best time to check your breasts is 5-7 days after  your period ends. Tell your doctor about any changes you see in your breasts. Changes include changes in size, changes on the skin, painful or tender breasts, or fluid from your nipples that is not normal. This information is not intended to replace advice given to you by your health care provider. Make sure you discuss any questions you have with your health care provider. Document Revised: 12/07/2020 Document Reviewed: 12/07/2020 Elsevier Patient Education  [place].

## 2022-01-11 ENCOUNTER — Other Ambulatory Visit: Payer: Self-pay | Admitting: Nurse Practitioner

## 2022-01-14 ENCOUNTER — Other Ambulatory Visit: Payer: Self-pay | Admitting: Nurse Practitioner

## 2022-01-14 NOTE — Telephone Encounter (Signed)
Requested medication (s) are due for refill today:   Yes  Requested medication (s) are on the active medication list:   Yes  Future visit scheduled:   No  Last 2 appts been cancelled, one by pt the other by provider   Last ordered: 10/16/2021 #90, 0 refills  Returned for provider review due to cancelled appts. No valid encounter per protocol.   Requested Prescriptions  Pending Prescriptions Disp Refills   escitalopram (LEXAPRO) 5 MG tablet [Pharmacy Med Name: ESCITALOPRAM 5 MG TABLET] 90 tablet 0    Sig: TAKE 1 TABLET BY MOUTH EVERY DAY     Psychiatry:  Antidepressants - SSRI Failed - 01/11/2022  2:22 AM      Failed - Valid encounter within last 6 months    Recent Outpatient Visits           7 months ago Postmenopausal vaginal bleeding   [facility], Corrie Dandy T, NP   7 months ago Cigarette smoker   [facility] Larae Grooms, NP   8 months ago Annual physical exam   [facility] Larae Grooms, NP   1 year ago Cigarette smoker   [facility] Larae Grooms, NP   1 year ago Need for influenza vaccination   [facility] Larae Grooms, NP              Passed - Completed PHQ-2 or PHQ-9 in the last 360 days

## 2022-01-15 NOTE — Telephone Encounter (Signed)
Requested medication (s) are due for refill today: yes  Requested medication (s) are on the active medication list: yes  Last refill:  10/24/21 #90  Future visit scheduled: no  Notes to clinic:  called pt and LM on VM to call back to make appt. Advised pt that there is another clinic listed as her PCP.    Requested Prescriptions  Pending Prescriptions Disp Refills   traZODone (DESYREL) 100 MG tablet [Pharmacy Med Name: TRAZODONE 100 MG TABLET] 90 tablet 0    Sig: TAKE 1 TABLET (100 MG) BY MOUTH AT BEDTIME. TAKE 1 TABLET BY MOUTH AT BEDTIME AS NEEDED FOR SLEEP     Psychiatry: Antidepressants - Serotonin Modulator Failed - 01/14/2022 10:14 AM      Failed - Valid encounter within last 6 months    Recent Outpatient Visits           7 months ago Postmenopausal vaginal bleeding   [facility] [place], Corrie Dandy T, NP   7 months ago Cigarette smoker   [facility] Larae Grooms, NP   8 months ago Annual physical exam   [place] Larae Grooms, NP   1 year ago Cigarette smoker   [facility] Larae Grooms, NP   1 year ago Need for influenza vaccination   [facility] Larae Grooms, NP              Passed - Completed PHQ-2 or PHQ-9 in the last 360 days

## 2022-02-19 ENCOUNTER — Other Ambulatory Visit: Payer: Self-pay

## 2022-02-19 DIAGNOSIS — Z1231 Encounter for screening mammogram for malignant neoplasm of breast: Secondary | ICD-10-CM

## 2022-04-11 ENCOUNTER — Ambulatory Visit
Admission: RE | Admit: 2022-04-11 | Discharge: 2022-04-11 | Disposition: A | Discharge status: Home / Self Care | Payer: No Typology Code available for payment source | Source: Ambulatory Visit | Attending: Surgery | Admitting: Surgery

## 2022-04-11 DIAGNOSIS — Z1231 Encounter for screening mammogram for malignant neoplasm of breast: Secondary | ICD-10-CM

## 2022-04-17 ENCOUNTER — Encounter: Payer: Self-pay | Admitting: Surgery

## 2022-04-18 ENCOUNTER — Ambulatory Visit: Payer: No Typology Code available for payment source | Admitting: Surgery

## 2022-12-04 ENCOUNTER — Ambulatory Visit: Payer: No Typology Code available for payment source | Admitting: Nurse Practitioner

## 2022-12-18 NOTE — Progress Notes (Deleted)
   LMP 04/28/2018 Comment: not preg   Subjective:    Patient ID: Megan Mendoza, female    DOB: 1970-12-03, 52 y.o.   MRN: [id number]  HPI: Megan Mendoza is a 52 y.o. female  No chief complaint on file.   Relevant past medical, surgical, family and social history reviewed and updated as indicated. Interim medical history since our last visit reviewed. Allergies and medications reviewed and updated.  Review of Systems  Per HPI unless specifically indicated above     Objective:    LMP 04/28/2018 Comment: not preg  Wt Readings from Last 3 Encounters:  11/27/21 191 lb (86.6 kg)  07/19/21 190 lb (86.2 kg)  07/06/21 192 lb (87.1 kg)    Physical Exam  Results for orders placed or performed in visit on 06/18/21  T4, free  Result Value Ref Range   Free T4 1.23 0.82 - 1.77 ng/dL  TSH  Result Value Ref Range   TSH 2.260 0.450 - 4.500 uIU/mL  Comprehensive metabolic panel  Result Value Ref Range   Glucose 86 70 - 99 mg/dL   BUN 22 6 - 24 mg/dL   Creatinine, Ser 4.09 0.57 - 1.00 mg/dL   eGFR 98 >81 XB/JYN/8.29   BUN/Creatinine Ratio 30 (H) 9 - 23   Sodium 139 134 - 144 mmol/L   Potassium 4.1 3.5 - 5.2 mmol/L   Chloride 101 96 - 106 mmol/L   CO2 24 20 - 29 mmol/L   Calcium 9.1 8.7 - 10.2 mg/dL   Total Protein 6.2 6.0 - 8.5 g/dL   Albumin 4.0 3.8 - 4.9 g/dL   Globulin, Total 2.2 1.5 - 4.5 g/dL   Albumin/Globulin Ratio 1.8 1.2 - 2.2   Bilirubin Total <0.2 0.0 - 1.2 mg/dL   Alkaline Phosphatase 73 44 - 121 IU/L   AST 21 0 - 40 IU/L   ALT 19 0 - 32 IU/L  CBC with Differential/Platelet  Result Value Ref Range   WBC 9.1 3.4 - 10.8 x10E3/uL   RBC 4.34 3.77 - 5.28 x10E6/uL   Hemoglobin 13.4 11.1 - 15.9 g/dL   Hematocrit 56.2 13.0 - 46.6 %   MCV 92 79 - 97 fL   MCH 30.9 26.6 - 33.0 pg   MCHC 33.4 31.5 - 35.7 g/dL   RDW 86.5 78.4 - 69.6 %   Platelets 215 150 - 450 x10E3/uL   Neutrophils 59 Not Estab. %   Lymphs 33 Not Estab. %   Monocytes 5 Not Estab. %   Eos 2  Not Estab. %   Basos 1 Not Estab. %   Neutrophils Absolute 5.4 1.4 - 7.0 x10E3/uL   Lymphocytes Absolute 3.0 0.7 - 3.1 x10E3/uL   Monocytes Absolute 0.5 0.1 - 0.9 x10E3/uL   EOS (ABSOLUTE) 0.2 0.0 - 0.4 x10E3/uL   Basophils Absolute 0.1 0.0 - 0.2 x10E3/uL   Immature Granulocytes 0 Not Estab. %   Immature Grans (Abs) 0.0 0.0 - 0.1 x10E3/uL  FSH/LH  Result Value Ref Range   LH 24.5 mIU/mL   FSH 42.8 mIU/mL      Assessment & Plan:   Problem List Items Addressed This Visit   None    Follow up plan: No follow-ups on file.

## 2022-12-19 ENCOUNTER — Ambulatory Visit: Payer: No Typology Code available for payment source | Admitting: Nurse Practitioner

## 2023-01-01 ENCOUNTER — Other Ambulatory Visit (HOSPITAL_COMMUNITY)
Admission: RE | Admit: 2023-01-01 | Discharge: 2023-01-01 | Disposition: A | Discharge status: Home / Self Care | Payer: No Typology Code available for payment source | Source: Ambulatory Visit | Attending: Nurse Practitioner | Admitting: Nurse Practitioner

## 2023-01-01 ENCOUNTER — Encounter: Payer: Self-pay | Admitting: Nurse Practitioner

## 2023-01-01 ENCOUNTER — Ambulatory Visit: Payer: No Typology Code available for payment source | Admitting: Nurse Practitioner

## 2023-01-01 VITALS — BP 128/83 | HR 77 | Temp 98.1°F | Ht 65.0 in

## 2023-01-01 DIAGNOSIS — Z0000 Encounter for general adult medical examination without abnormal findings: Secondary | ICD-10-CM

## 2023-01-01 DIAGNOSIS — H6123 Impacted cerumen, bilateral: Secondary | ICD-10-CM

## 2023-01-01 DIAGNOSIS — Z136 Encounter for screening for cardiovascular disorders: Secondary | ICD-10-CM | POA: Diagnosis not present

## 2023-01-01 LAB — MICROSCOPIC EXAMINATION
Bacteria, UA: NONE SEEN
WBC, UA: NONE SEEN /[HPF] (ref 0–5)

## 2023-01-01 LAB — URINALYSIS, ROUTINE W REFLEX MICROSCOPIC
Bilirubin, UA: NEGATIVE
Glucose, UA: NEGATIVE
Ketones, UA: NEGATIVE
Leukocytes,UA: NEGATIVE
Nitrite, UA: NEGATIVE
Protein,UA: NEGATIVE
Specific Gravity, UA: >1.03 — ABNORMAL HIGH (ref 1.005–1.030)
Urobilinogen, Ur: 1 mg/dL (ref 0.2–1.0)
pH, UA: 6.5 (ref 5.0–7.5)

## 2023-01-01 NOTE — Progress Notes (Signed)
BP 128/83 (BP Location: Left Arm, Patient Position: Sitting, Cuff Size: Large)   Pulse 77   Temp 98.1 F (36.7 C) (Oral)   Ht 5\' 5"  (1.651 m)   LMP 04/28/2018 Comment: not preg  SpO2 98%   BMI 31.78 kg/m    Subjective:    Patient ID: Megan Mendoza, female    DOB: 09/05/1970, 52 y.o.   MRN: [id number]  HPI: Megan Mendoza is a 52 y.o. female presenting on 01/01/2023 for comprehensive medical examination. Current medical complaints include:none  She currently lives with: Menopausal Symptoms: no    Depression Screen done today and results listed below:     01/01/2023    2:52 PM 04/26/2021    8:10 AM 10/27/2020    2:46 PM 01/07/2020    2:25 PM 10/13/2019    4:56 PM  Depression screen PHQ 2/9  Decreased Interest 3 0 0 0 1  Down, Depressed, Hopeless 3 0 0 0 1  PHQ - 2 Score 6 0 0 0 2  Altered sleeping 2 0 0 3 2  Tired, decreased energy 3 0 3 3 3   Change in appetite 1 0 0 0 1  Feeling bad or failure about yourself  0 0 0 0 1  Trouble concentrating 0 0 0 0 0  Moving slowly or fidgety/restless 0 0 0 0 0  Suicidal thoughts 0 0 0 0 0  PHQ-9 Score 12 0 3 6 9   Difficult doing work/chores  Not difficult at all       The patient does not have a history of falls. I did complete a risk assessment for falls. A plan of care for falls was documented.   Past Medical History:  Past Medical History:  Diagnosis Date   Complication of anesthesia    Insomnia    Perimenopausal    PONV (postoperative nausea and vomiting)     Surgical History:  Past Surgical History:  Procedure Laterality Date   BREAST BIOPSY Right 05/09/2021   right stereo bx ribbon clip ATYPICAL DUCTAL HYPERPLASIA, INVOLVING COLUMNARCELL LESION, WITH ASSOCIATED COARSE CALCIFICATIONS - BACKGROUNDMAMMARY PARENCHYMA DISPLAYING FIBROCYSTIC AND APOCRINE CHANGES, ANDFOCAL PSEUDOANGIOMATOUS STROMAL HYPERPLASIA (PASH) - NEGATIVE FORCARCINOMA IN SITU AND INVASIVE MAMMARY CARCINOMA of the RIGHT   BREAST BIOPSY WITH  RADIO FREQUENCY LOCALIZER Right 06/15/2021   Procedure: BREAST BIOPSY WITH RADIO FREQUENCY LOCALIZER;  Surgeon: Campbell Lerner, MD;  Location: [facility] OR[facility];  Service: General;  Laterality: Right;   BREAST EXCISIONAL BIOPSY Right 06/15/2021   BREAST LUMPECTOMY WITH RADIOFREQUENCY TAG IDENTIFICATION Right 05/24/2021   CESAREAN SECTION     2   TUBAL LIGATION      Medications:  Current Outpatient Medications on File Prior to Visit  Medication Sig   escitalopram (LEXAPRO) 5 MG tablet TAKE 1 TABLET BY MOUTH EVERY DAY   Melatonin 10 MG TABS Take 10 mg by mouth as needed.   phentermine 15 MG capsule Take 15 mg by mouth every morning. Take one tablet once daily   phentermine 37.5 MG capsule Take 37.5 mg by mouth every morning. Take one tablet once daily   traZODone (DESYREL) 100 MG tablet TAKE 1 TABLET (100 MG) BY MOUTH AT BEDTIME. TAKE 1 TABLET BY MOUTH AT BEDTIME AS NEEDED FOR SLEEP   varenicline (CHANTIX) 1 MG tablet TAKE 1 TABLET BY MOUTH TWICE A DAY   No current facility-administered medications on file prior to visit.    Allergies:  Allergies  Allergen Reactions   Aspirin Nausea And Vomiting  Social History:  Social History   Socioeconomic History   Marital status: Married    Spouse name: Lorella Nimrod   Number of children: 1   Years of education: Not on file   Highest education level: Not on file  Occupational History   Not on file  Tobacco Use   Smoking status: Former    Current packs/day: 0.00    Types: Cigarettes    Quit date: 05/28/2021    Years since quitting: 1.5    Passive exposure: Past   Smokeless tobacco: Never  Vaping Use   Vaping status: Never Used  Substance and Sexual Activity   Alcohol use: Not Currently    Comment: ocassionally   Drug use: Never   Sexual activity: Yes    Birth control/protection: Post-menopausal, Surgical    Comment: BTL  Other Topics Concern   Not on file  Social History Narrative   Not on file   Social Determinants of Health    Financial Resource Strain: Not on file  Food Insecurity: Not on file  Transportation Needs: Not on file  Physical Activity: Not on file  Stress: Not on file  Social Connections: Not on file  Intimate Partner Violence: Not on file   Social History   Tobacco Use  Smoking Status Former   Current packs/day: 0.00   Types: Cigarettes   Quit date: 05/28/2021   Years since quitting: 1.5   Passive exposure: Past  Smokeless Tobacco Never   Social History   Substance and Sexual Activity  Alcohol Use Not Currently   Comment: ocassionally    Family History:  Family History  Problem Relation Age of Onset   Heart disease Mother    Lung cancer Father    Asthma Son    ADD / ADHD Son    Breast cancer Neg Hx     Past medical history, surgical history, medications, allergies, family history and social history reviewed with patient today and changes made to appropriate areas of the chart.   Review of Systems  HENT:         Ear wax  Psychiatric/Behavioral:  Positive for depression. The patient is nervous/anxious.    All other ROS negative except what is listed above and in the HPI.      Objective:    BP 128/83 (BP Location: Left Arm, Patient Position: Sitting, Cuff Size: Large)   Pulse 77   Temp 98.1 F (36.7 C) (Oral)   Ht 5\' 5"  (1.651 m)   LMP 04/28/2018 Comment: not preg  SpO2 98%   BMI 31.78 kg/m   Wt Readings from Last 3 Encounters:  11/27/21 191 lb (86.6 kg)  07/19/21 190 lb (86.2 kg)  07/06/21 192 lb (87.1 kg)    Physical Exam Vitals and nursing note reviewed. Exam conducted with a chaperone present Oswaldo Conroy, CMA).  Constitutional:      General: She is awake. She is not in acute distress.    Appearance: Normal appearance. She is well-developed. She is not ill-appearing.  HENT:     Head: Normocephalic and atraumatic.     Right Ear: Hearing, tympanic membrane, ear canal and external ear normal. No drainage. There is impacted cerumen.     Left Ear:  Hearing, tympanic membrane, ear canal and external ear normal. No drainage. There is impacted cerumen.     Nose: Nose normal.     Right Sinus: No maxillary sinus tenderness or frontal sinus tenderness.     Left Sinus: No maxillary sinus tenderness or  frontal sinus tenderness.     Mouth/Throat:     Mouth: Mucous membranes are moist.     Pharynx: Oropharynx is clear. Uvula midline. No pharyngeal swelling, oropharyngeal exudate or posterior oropharyngeal erythema.  Eyes:     General: Lids are normal.        Right eye: No discharge.        Left eye: No discharge.     Extraocular Movements: Extraocular movements intact.     Conjunctiva/sclera: Conjunctivae normal.     Pupils: Pupils are equal, round, and reactive to light.     Visual Fields: Right eye visual fields normal and left eye visual fields normal.  Neck:     Thyroid: No thyromegaly.     Vascular: No carotid bruit.     Trachea: Trachea normal.  Cardiovascular:     Rate and Rhythm: Normal rate and regular rhythm.     Heart sounds: Normal heart sounds. No murmur heard.    No gallop.  Pulmonary:     Effort: Pulmonary effort is normal. No accessory muscle usage or respiratory distress.     Breath sounds: Normal breath sounds.  Chest:  Breasts:    Right: Normal.     Left: Normal.  Abdominal:     General: Bowel sounds are normal.     Palpations: Abdomen is soft. There is no hepatomegaly or splenomegaly.     Tenderness: There is no abdominal tenderness.  Genitourinary:    Exam position: Knee-chest position.     Vagina: Normal.     Cervix: Normal.     Adnexa: Right adnexa normal and left adnexa normal.  Musculoskeletal:        General: Normal range of motion.     Cervical back: Normal range of motion and neck supple.     Right lower leg: No edema.     Left lower leg: No edema.  Lymphadenopathy:     Head:     Right side of head: No submental, submandibular, tonsillar, preauricular or posterior auricular adenopathy.     Left  side of head: No submental, submandibular, tonsillar, preauricular or posterior auricular adenopathy.     Cervical: No cervical adenopathy.     Upper Body:     Right upper body: No supraclavicular, axillary or pectoral adenopathy.     Left upper body: No supraclavicular, axillary or pectoral adenopathy.  Skin:    General: Skin is warm and dry.     Capillary Refill: Capillary refill takes less than 2 seconds.     Findings: No rash.  Neurological:     Mental Status: She is alert and oriented to person, place, and time.     Gait: Gait is intact.  Psychiatric:        Attention and Perception: Attention normal.        Mood and Affect: Mood normal.        Speech: Speech normal.        Behavior: Behavior normal. Behavior is cooperative.        Thought Content: Thought content normal.        Judgment: Judgment normal.     Results for orders placed or performed in visit on 06/18/21  T4, free  Result Value Ref Range   Free T4 1.23 0.82 - 1.77 ng/dL  TSH  Result Value Ref Range   TSH 2.260 0.450 - 4.500 uIU/mL  Comprehensive metabolic panel  Result Value Ref Range   Glucose 86 70 - 99 mg/dL   BUN  22 6 - 24 mg/dL   Creatinine, Ser 4.13 0.57 - 1.00 mg/dL   eGFR 98 >24 MW/NUU/7.25   BUN/Creatinine Ratio 30 (H) 9 - 23   Sodium 139 134 - 144 mmol/L   Potassium 4.1 3.5 - 5.2 mmol/L   Chloride 101 96 - 106 mmol/L   CO2 24 20 - 29 mmol/L   Calcium 9.1 8.7 - 10.2 mg/dL   Total Protein 6.2 6.0 - 8.5 g/dL   Albumin 4.0 3.8 - 4.9 g/dL   Globulin, Total 2.2 1.5 - 4.5 g/dL   Albumin/Globulin Ratio 1.8 1.2 - 2.2   Bilirubin Total <0.2 0.0 - 1.2 mg/dL   Alkaline Phosphatase 73 44 - 121 IU/L   AST 21 0 - 40 IU/L   ALT 19 0 - 32 IU/L  CBC with Differential/Platelet  Result Value Ref Range   WBC 9.1 3.4 - 10.8 x10E3/uL   RBC 4.34 3.77 - 5.28 x10E6/uL   Hemoglobin 13.4 11.1 - 15.9 g/dL   Hematocrit 36.6 44.0 - 46.6 %   MCV 92 79 - 97 fL   MCH 30.9 26.6 - 33.0 pg   MCHC 33.4 31.5 - 35.7 g/dL    RDW 34.7 42.5 - 95.6 %   Platelets 215 150 - 450 x10E3/uL   Neutrophils 59 Not Estab. %   Lymphs 33 Not Estab. %   Monocytes 5 Not Estab. %   Eos 2 Not Estab. %   Basos 1 Not Estab. %   Neutrophils Absolute 5.4 1.4 - 7.0 x10E3/uL   Lymphocytes Absolute 3.0 0.7 - 3.1 x10E3/uL   Monocytes Absolute 0.5 0.1 - 0.9 x10E3/uL   EOS (ABSOLUTE) 0.2 0.0 - 0.4 x10E3/uL   Basophils Absolute 0.1 0.0 - 0.2 x10E3/uL   Immature Granulocytes 0 Not Estab. %   Immature Grans (Abs) 0.0 0.0 - 0.1 x10E3/uL  FSH/LH  Result Value Ref Range   LH 24.5 mIU/mL   FSH 42.8 mIU/mL      Assessment & Plan:   Problem List Items Addressed This Visit   None Visit Diagnoses     Annual physical exam    -  Primary   Health maintenance reviewed during visit today.  Labs ordered.  PAP done.  Mammogram and Cologuard up to date.   Relevant Orders   CBC with Differential/Platelet   Comprehensive metabolic panel   Lipid panel   TSH   Urinalysis, Routine w reflex microscopic   Cytology - PAP   Screening for ischemic heart disease       Relevant Orders   Lipid panel   Bilateral impacted cerumen            Follow up plan: Return in about 1 year (around 01/01/2024) for Physical and Fasting labs.   LABORATORY TESTING:  - Pap smear: pap done  IMMUNIZATIONS:   - Tdap: Tetanus vaccination status reviewed: last tetanus booster within 10 years. - Influenza: Refused - Pneumovax: Not applicable - Prevnar: not applicable - COVID: Refused - HPV: Not applicable - Shingrix vaccine: Refused  SCREENING: -Mammogram: Up to date  - Colonoscopy: Up to date  - Bone Density: Not applicable  -Hearing Test: Not applicable  -Spirometry: Not applicable   PATIENT COUNSELING:   Advised to take 1 mg of folate supplement per day if capable of pregnancy.   Sexuality: Discussed sexually transmitted diseases, partner selection, use of condoms, avoidance of unintended pregnancy  and contraceptive alternatives.   Advised to  avoid cigarette smoking.  I discussed with the  patient that most people either abstain from alcohol or drink within safe limits (<=14/week and <=4 drinks/occasion for males, <=7/weeks and <= 3 drinks/occasion for females) and that the risk for alcohol disorders and other health effects rises proportionally with the number of drinks per week and how often a drinker exceeds daily limits.  Discussed cessation/primary prevention of drug use and availability of treatment for abuse.   Diet: Encouraged to adjust caloric intake to maintain  or achieve ideal body weight, to reduce intake of dietary saturated fat and total fat, to limit sodium intake by avoiding high sodium foods and not adding table salt, and to maintain adequate dietary potassium and calcium preferably from fresh fruits, vegetables, and low-fat dairy products.    stressed the importance of regular exercise  Injury prevention: Discussed safety belts, safety helmets, smoke detector, smoking near bedding or upholstery.   Dental health: Discussed importance of regular tooth brushing, flossing, and dental visits.    NEXT PREVENTATIVE PHYSICAL DUE IN 1 YEAR. Return in about 1 year (around 01/01/2024) for Physical and Fasting labs.

## 2023-01-02 LAB — LIPID PANEL
Chol/HDL Ratio: 2.8 ratio (ref 0.0–4.4)
Cholesterol, Total: 190 mg/dL (ref 100–199)
HDL: 68 mg/dL (ref >39)
LDL Chol Calc (NIH): 98 mg/dL (ref 0–99)
Triglycerides: 140 mg/dL (ref 0–149)
VLDL Cholesterol Cal: 24 mg/dL (ref 5–40)

## 2023-01-02 LAB — CBC WITH DIFFERENTIAL/PLATELET
Basophils Absolute: 0.1 10*3/uL (ref 0.0–0.2)
Basos: 1 %
EOS (ABSOLUTE): 0.2 10*3/uL (ref 0.0–0.4)
Eos: 2 %
Hematocrit: 43.7 % (ref 34.0–46.6)
Hemoglobin: 14.2 g/dL (ref 11.1–15.9)
Immature Grans (Abs): 0 10*3/uL (ref 0.0–0.1)
Immature Granulocytes: 0 %
Lymphocytes Absolute: 2.8 10*3/uL (ref 0.7–3.1)
Lymphs: 31 %
MCH: 30.8 pg (ref 26.6–33.0)
MCHC: 32.5 g/dL (ref 31.5–35.7)
MCV: 95 fL (ref 79–97)
Monocytes Absolute: 0.6 10*3/uL (ref 0.1–0.9)
Monocytes: 6 %
Neutrophils Absolute: 5.4 10*3/uL (ref 1.4–7.0)
Neutrophils: 60 %
Platelets: 267 10*3/uL (ref 150–450)
RBC: 4.61 x10E6/uL (ref 3.77–5.28)
RDW: 11.8 % (ref 11.7–15.4)
WBC: 9 10*3/uL (ref 3.4–10.8)

## 2023-01-02 LAB — COMPREHENSIVE METABOLIC PANEL
ALT: 33 [IU]/L — ABNORMAL HIGH (ref 0–32)
AST: 28 [IU]/L (ref 0–40)
Albumin: 4.4 g/dL (ref 3.8–4.9)
Alkaline Phosphatase: 106 [IU]/L (ref 44–121)
BUN/Creatinine Ratio: 14 (ref 9–23)
BUN: 11 mg/dL (ref 6–24)
Bilirubin Total: 0.3 mg/dL (ref 0.0–1.2)
CO2: 23 mmol/L (ref 20–29)
Calcium: 9.6 mg/dL (ref 8.7–10.2)
Chloride: 98 mmol/L (ref 96–106)
Creatinine, Ser: 0.77 mg/dL (ref 0.57–1.00)
Globulin, Total: 2.6 g/dL (ref 1.5–4.5)
Glucose: 91 mg/dL (ref 70–99)
Potassium: 4.1 mmol/L (ref 3.5–5.2)
Sodium: 139 mmol/L (ref 134–144)
Total Protein: 7 g/dL (ref 6.0–8.5)
eGFR: 93 mL/min/{1.73_m2} (ref >59)

## 2023-01-02 LAB — TSH: TSH: 3.12 u[IU]/mL (ref 0.450–4.500)

## 2023-01-03 LAB — CYTOLOGY - PAP
Adequacy: ABSENT
Diagnosis: NEGATIVE

## 2023-03-25 ENCOUNTER — Other Ambulatory Visit: Payer: Self-pay | Admitting: Nurse Practitioner

## 2023-03-25 DIAGNOSIS — Z1231 Encounter for screening mammogram for malignant neoplasm of breast: Secondary | ICD-10-CM

## 2023-04-14 ENCOUNTER — Inpatient Hospital Stay: Admission: RE | Admit: 2023-04-14 | Payer: No Typology Code available for payment source | Source: Ambulatory Visit

## 2023-04-14 ENCOUNTER — Ambulatory Visit
Admission: RE | Admit: 2023-04-14 | Discharge: 2023-04-14 | Disposition: A | Discharge status: Home / Self Care | Payer: No Typology Code available for payment source | Source: Ambulatory Visit | Attending: Nurse Practitioner | Admitting: Nurse Practitioner

## 2023-04-14 DIAGNOSIS — Z1231 Encounter for screening mammogram for malignant neoplasm of breast: Secondary | ICD-10-CM | POA: Insufficient documentation

## 2023-10-03 ENCOUNTER — Other Ambulatory Visit: Payer: Self-pay | Admitting: Medical Genetics

## 2023-10-21 ENCOUNTER — Other Ambulatory Visit
Admission: RE | Admit: 2023-10-21 | Discharge: 2023-10-21 | Disposition: A | Discharge status: Home / Self Care | Source: Ambulatory Visit | Attending: Medical Genetics | Admitting: Medical Genetics

## 2023-10-28 LAB — GENECONNECT MOLECULAR SCREEN: Genetic Analysis Overall Interpretation: NEGATIVE

## 2024-02-23 IMAGING — MG MM DIGITAL DIAGNOSTIC UNILAT*R* W/ TOMO W/ CAD
4 series · 4 of 8 positions shown · non-contrast
Comparison: Previous exam(s).

CLINICAL DATA: 50-year-old female for further evaluation of RIGHT
breast calcifications identified on screening mammogram.

EXAM:
DIGITAL DIAGNOSTIC UNILATERAL RIGHT MAMMOGRAM WITH TOMOSYNTHESIS AND
CAD
TECHNIQUE: Right digital diagnostic mammography and breast tomosynthesis was
performed. The images were evaluated with computer-aided detection.

[R CC]
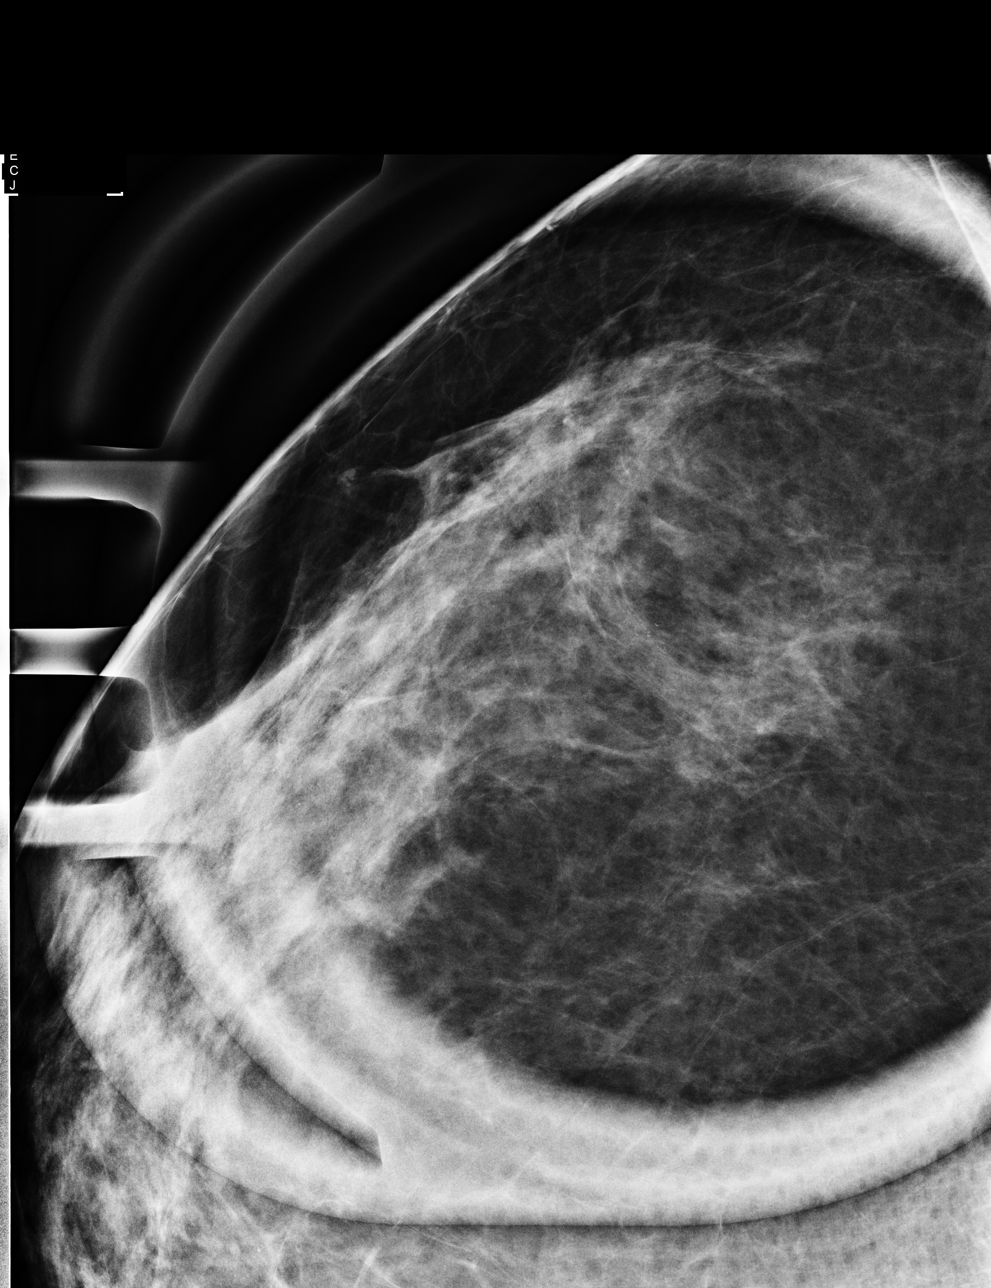

[R ML (1 of 2)]
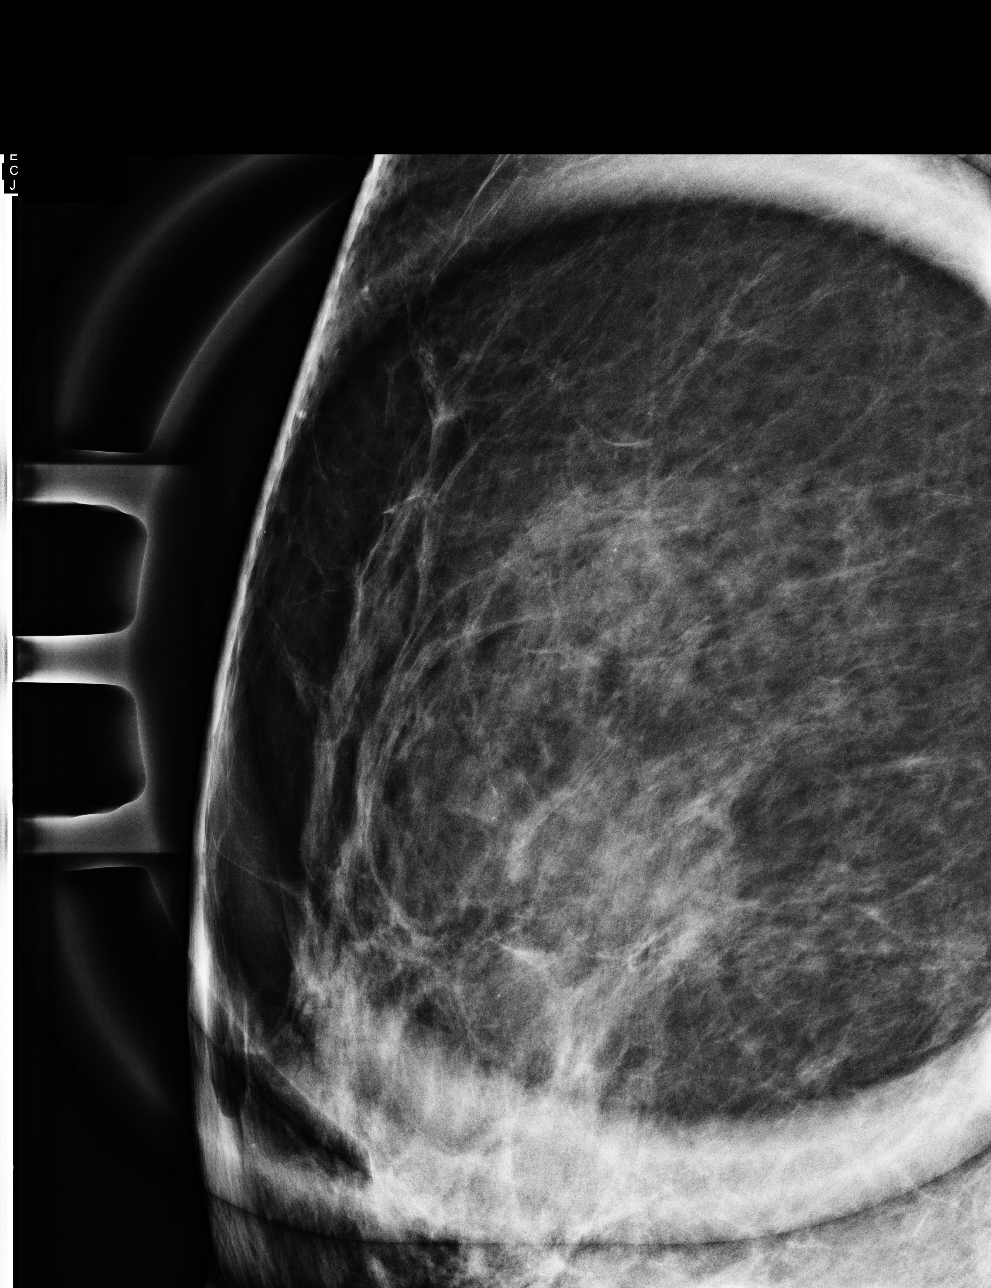

[R ML (2 of 2)]
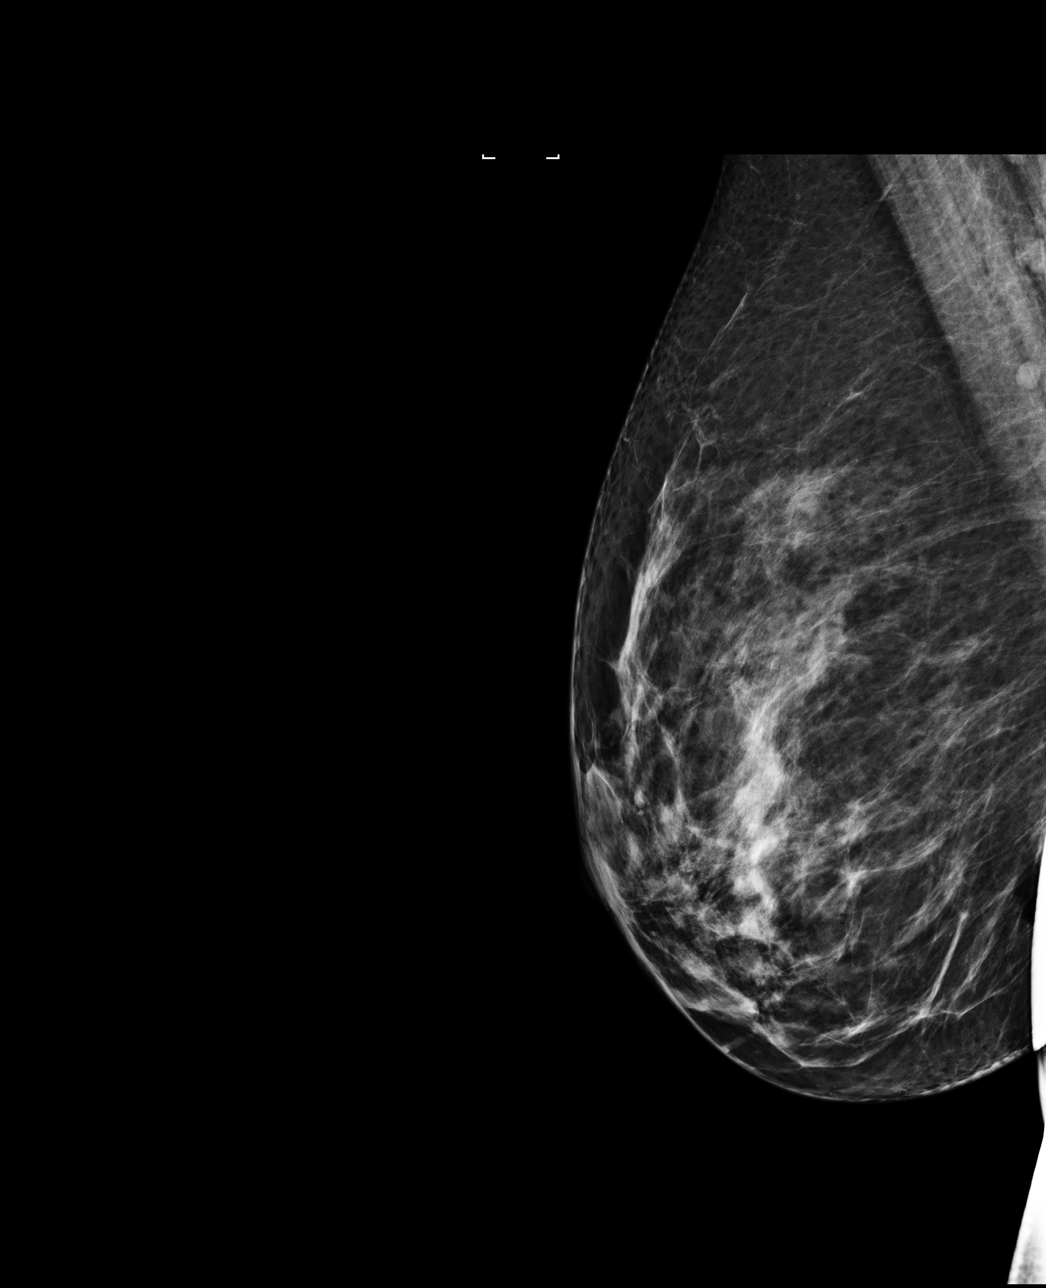

[R ML tomo · tomo slice 45/89.0]
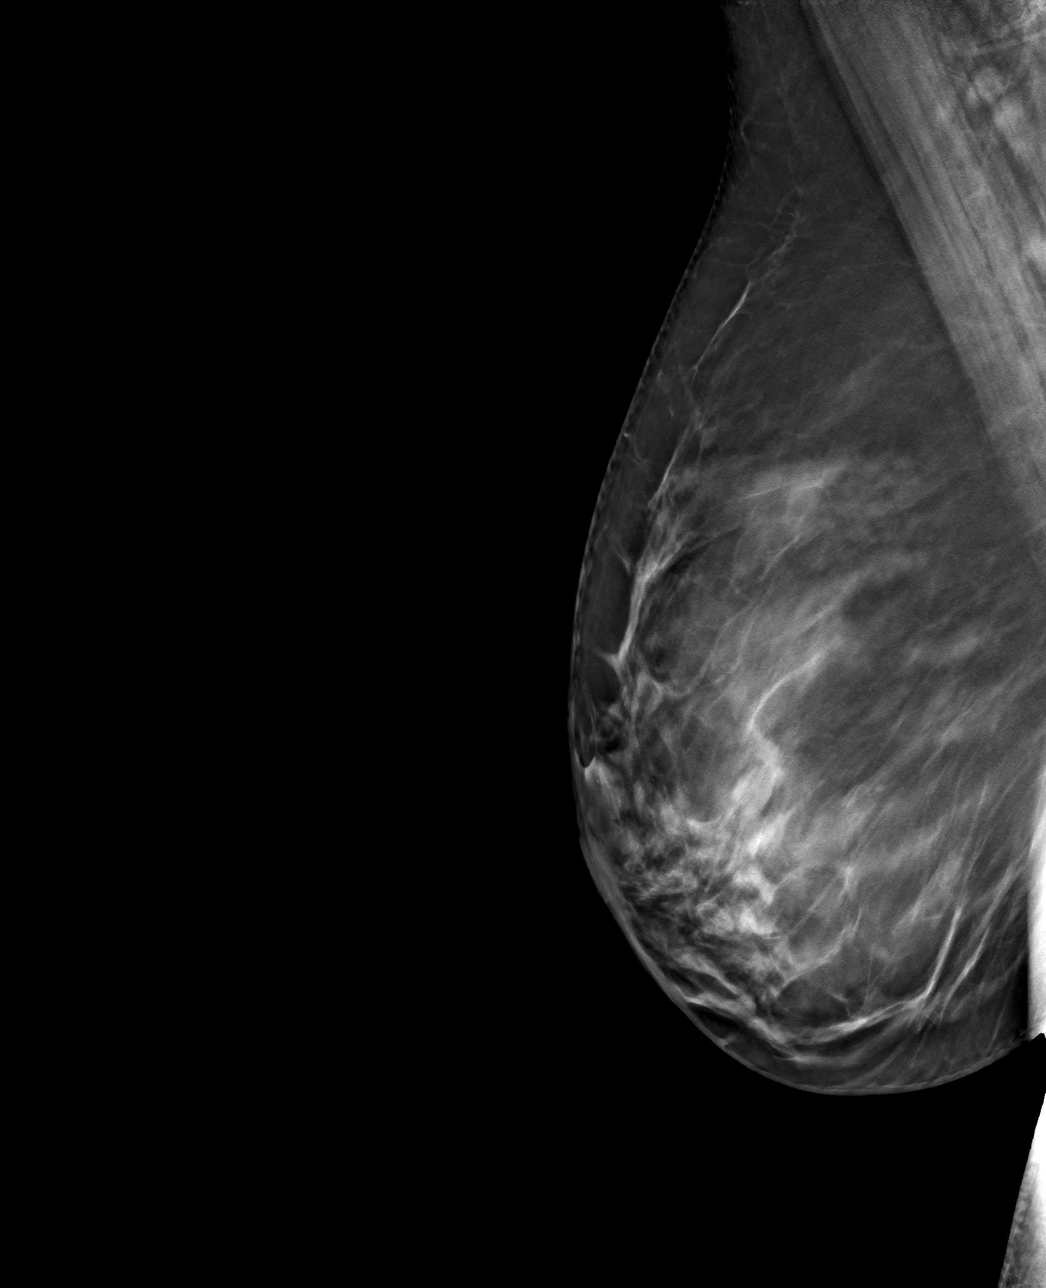

[4 of 8 positions shown; findings below may reference images not displayed]

ACR Breast Density Category c: The breast tissue is heterogeneously
dense, which may obscure small masses.
FINDINGS: Full field and magnification views of the RIGHT breast demonstrate a
0.4 cm group of slightly heterogeneous and amorphous calcifications
within the UPPER-OUTER RIGHT breast, middle to posterior depth.
IMPRESSION: Indeterminate 0.4 cm group of UPPER-OUTER RIGHT breast
calcifications. Tissue sampling is recommended.

RECOMMENDATION:
3D/stereotactic guided RIGHT breast biopsy, which will be scheduled.

I have discussed the findings and recommendations with the patient.
If applicable, a reminder letter will be sent to the patient
regarding the next appointment.

BI-RADS CATEGORY  4: Suspicious.

## 2024-02-26 ENCOUNTER — Encounter: Payer: Self-pay | Admitting: Nurse Practitioner

## 2024-03-04 ENCOUNTER — Encounter: Payer: Self-pay | Admitting: Nurse Practitioner

## 2024-03-04 ENCOUNTER — Ambulatory Visit (INDEPENDENT_AMBULATORY_CARE_PROVIDER_SITE_OTHER): Payer: Self-pay | Admitting: Nurse Practitioner

## 2024-03-04 VITALS — BP 141/93 | HR 89 | Temp 98.5°F | Ht 65.0 in | Wt 192.4 lb

## 2024-03-04 DIAGNOSIS — R030 Elevated blood-pressure reading, without diagnosis of hypertension: Secondary | ICD-10-CM | POA: Diagnosis not present

## 2024-03-04 DIAGNOSIS — E7800 Pure hypercholesterolemia, unspecified: Secondary | ICD-10-CM

## 2024-03-04 DIAGNOSIS — Z716 Tobacco abuse counseling: Secondary | ICD-10-CM | POA: Diagnosis not present

## 2024-03-04 DIAGNOSIS — Z23 Encounter for immunization: Secondary | ICD-10-CM | POA: Diagnosis not present

## 2024-03-04 DIAGNOSIS — Z1211 Encounter for screening for malignant neoplasm of colon: Secondary | ICD-10-CM

## 2024-03-04 DIAGNOSIS — Z0000 Encounter for general adult medical examination without abnormal findings: Secondary | ICD-10-CM | POA: Diagnosis not present

## 2024-03-04 LAB — CBC
Hematocrit: 45.5 % (ref 34.0–46.6)
Hemoglobin: 16 g/dL — ABNORMAL HIGH (ref 11.1–15.9)
MCH: 31.9 pg (ref 26.6–33.0)
MCHC: 35.2 g/dL (ref 31.5–35.7)
MCV: 91 fL (ref 79–97)
Platelets: 173 x10E3/uL (ref 150–450)
RBC: 5.02 x10E6/uL (ref 3.77–5.28)
RDW: 14.4 % (ref 11.7–15.4)
WBC: 9.7 x10E3/uL (ref 3.4–10.8)

## 2024-03-04 MED ORDER — VARENICLINE TARTRATE 1 MG PO TABS: 1.0000 mg | ORAL_TABLET | Freq: Two times a day (BID) | ORAL | 2 refills | Status: AC

## 2024-03-04 NOTE — Progress Notes (Unsigned)
 "  BP (!) 141/93 (BP Location: Right Arm, Cuff Size: Normal)   Pulse 89   Temp 98.5 F (36.9 C) (Oral)   Ht 5' 5 (1.651 m)   Wt 192 lb 6.4 oz (87.3 kg)   LMP 04/28/2018 Comment: not preg  SpO2 94%   BMI 32.02 kg/m    Subjective:    Patient ID: Megan Mendoza, female    DOB: Jun 19, 1970, 54 y.o.   MRN: [id number]  HPI: Megan Mendoza is a 54 y.o. female presenting on 03/04/2024 for comprehensive medical examination. Current medical complaints include:none  She currently lives with: Menopausal Symptoms: no  SMOKING CESSATION Smoking Status:current everyday smoker Smoking Amount: 1ppd Smoking Onset:  Smoking Quit Date:  Smoking triggers: Type of tobacco use: cigarettes Other household members who smoke: yes Treatments attempted:  Pneumovax:    Denies HA, CP, SOB, dizziness, palpitations, visual changes, and lower extremity swelling.   Patient states she is using Trazodone  every night for sleep.    Depression Screen done today and results listed below:     03/04/2024    3:31 PM 01/01/2023    2:52 PM 04/26/2021    8:10 AM 10/27/2020    2:46 PM 01/07/2020    2:25 PM  Depression screen PHQ 2/9  Decreased Interest 1 3 0 0 0  Down, Depressed, Hopeless 1 3 0 0 0  PHQ - 2 Score 2 6 0 0 0  Altered sleeping 2 2 0 0 3  Tired, decreased energy 2 3 0 3 3  Change in appetite 1 1 0 0 0  Feeling bad or failure about yourself  1 0 0 0 0  Trouble concentrating 1 0 0 0 0  Moving slowly or fidgety/restless 0 0 0 0 0  Suicidal thoughts 0 0 0 0 0  PHQ-9 Score 9 12  0  3  6   Difficult doing work/chores Not difficult at all  Not difficult at all       Data saved with a previous flowsheet row definition    The patient does not have a history of falls. I did complete a risk assessment for falls. A plan of care for falls was documented.   Past Medical History:  Past Medical History:  Diagnosis Date   Complication of anesthesia    Insomnia    Perimenopausal    PONV  (postoperative nausea and vomiting)     Surgical History:  Past Surgical History:  Procedure Laterality Date   BREAST BIOPSY Right 05/09/2021   right stereo bx ribbon clip ATYPICAL DUCTAL HYPERPLASIA, INVOLVING COLUMNARCELL LESION, WITH ASSOCIATED COARSE CALCIFICATIONS - BACKGROUNDMAMMARY PARENCHYMA DISPLAYING FIBROCYSTIC AND APOCRINE CHANGES, ANDFOCAL PSEUDOANGIOMATOUS STROMAL HYPERPLASIA (PASH) - NEGATIVE FORCARCINOMA IN SITU AND INVASIVE MAMMARY CARCINOMA of the RIGHT   BREAST BIOPSY WITH RADIO FREQUENCY LOCALIZER Right 06/15/2021   Procedure: BREAST BIOPSY WITH RADIO FREQUENCY LOCALIZER;  Surgeon: Lane Shope, MD;  Location: [facility] OR[facility];  Service: General;  Laterality: Right;   BREAST EXCISIONAL BIOPSY Right 06/15/2021   BREAST LUMPECTOMY WITH RADIOFREQUENCY TAG IDENTIFICATION Right 05/24/2021   CESAREAN SECTION     2   TUBAL LIGATION      Medications:  Current Outpatient Medications on File Prior to Visit  Medication Sig   Melatonin 10 MG TABS Take 10 mg by mouth as needed.   traZODone  (DESYREL ) 100 MG tablet TAKE 1 TABLET (100 MG) BY MOUTH AT BEDTIME. TAKE 1 TABLET BY MOUTH AT BEDTIME AS NEEDED FOR SLEEP   No current  facility-administered medications on file prior to visit.    Allergies:  Allergies  Allergen Reactions   Aspirin Nausea And Vomiting    Social History:  Social History   Socioeconomic History   Marital status: Married    Spouse name: Mitchell   Number of children: 1   Years of education: Not on file   Highest education level: Not on file  Occupational History   Not on file  Tobacco Use   Smoking status: Former    Current packs/day: 0.00    Types: Cigarettes    Quit date: 05/28/2021    Years since quitting: 2.7    Passive exposure: Past   Smokeless tobacco: Never  Vaping Use   Vaping status: Never Used  Substance and Sexual Activity   Alcohol use: Not Currently    Comment: ocassionally   Drug use: Never   Sexual activity: Yes    Birth  control/protection: Post-menopausal, Surgical    Comment: BTL  Other Topics Concern   Not on file  Social History Narrative   Not on file   Social Drivers of Health   Tobacco Use: Medium Risk (03/04/2024)   Patient History    Smoking Tobacco Use: Former    Smokeless Tobacco Use: Never    Passive Exposure: Past  Programmer, 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 on [employer] Insecurity: Not on file  Transportation Needs: Not on file  Physical Activity: Not on file  Stress: Not on file  Social Connections: Not on file  Intimate Partner Violence: Not on file  Depression (PHQ2-9): Medium Risk (03/04/2024)   Depression (PHQ2-9)    PHQ-2 Score: 9  Alcohol Screen: Not on file  Housing: Not on file  Utilities: Not on file  Health Literacy: Not on file   Social History   Tobacco Use  Smoking Status Former   Current packs/day: 0.00   Types: Cigarettes   Quit date: 05/28/2021   Years since quitting: 2.7   Passive exposure: Past  Smokeless Tobacco Never   Social History   Substance and Sexual Activity  Alcohol Use Not Currently   Comment: ocassionally    Family History:  Family History  Problem Relation Age of Onset   Heart disease Mother    Kidney disease Mother    Lung cancer Father    Cancer Father    Asthma Son    ADD / ADHD Son    ADD / ADHD Son    Breast cancer Neg Hx     Past medical history, surgical history, medications, allergies, family history and social history reviewed with patient today and changes made to appropriate areas of the chart.   Review of Systems  HENT:         Ear wax  Eyes:  Negative for blurred vision and double vision.  Respiratory:  Negative for shortness of breath.   Cardiovascular:  Negative for chest pain, palpitations and leg swelling.  Neurological:  Negative for dizziness and headaches.  Psychiatric/Behavioral:  Positive for depression. The patient is nervous/anxious.    All other ROS negative except what is listed above and in the HPI.       Objective:    BP (!) 141/93 (BP Location: Right Arm, Cuff Size: Normal)   Pulse 89   Temp 98.5 F (36.9 C) (Oral)   Ht 5' 5 (1.651 m)   Wt 192 lb 6.4 oz (87.3 kg)   LMP 04/28/2018 Comment: not preg  SpO2 94%   BMI 32.02 kg/m   Wt  Readings from Last 3 Encounters:  03/04/24 192 lb 6.4 oz (87.3 kg)  11/27/21 191 lb (86.6 kg)  07/19/21 190 lb (86.2 kg)    Physical Exam Vitals and nursing note reviewed.  Constitutional:      General: She is awake. She is not in acute distress.    Appearance: She is well-developed. She is not ill-appearing.  HENT:     Head: Normocephalic and atraumatic.     Right Ear: Hearing, tympanic membrane, ear canal and external ear normal. No drainage.     Left Ear: Hearing, tympanic membrane, ear canal and external ear normal. No drainage.     Nose: Nose normal.     Right Sinus: No maxillary sinus tenderness or frontal sinus tenderness.     Left Sinus: No maxillary sinus tenderness or frontal sinus tenderness.     Mouth/Throat:     Mouth: Mucous membranes are moist.     Pharynx: Oropharynx is clear. Uvula midline. No pharyngeal swelling, oropharyngeal exudate or posterior oropharyngeal erythema.  Eyes:     General: Lids are normal.        Right eye: No discharge.        Left eye: No discharge.     Extraocular Movements: Extraocular movements intact.     Conjunctiva/sclera: Conjunctivae normal.     Pupils: Pupils are equal, round, and reactive to light.     Visual Fields: Right eye visual fields normal and left eye visual fields normal.  Neck:     Thyroid: No thyromegaly.     Vascular: No carotid bruit.     Trachea: Trachea normal.  Cardiovascular:     Rate and Rhythm: Normal rate and regular rhythm.     Heart sounds: Normal heart sounds. No murmur heard.    No gallop.  Pulmonary:     Effort: Pulmonary effort is normal. No accessory muscle usage or respiratory distress.     Breath sounds: Normal breath sounds.  Chest:  Breasts:    Right:  Normal.     Left: Normal.  Abdominal:     General: Bowel sounds are normal.     Palpations: Abdomen is soft. There is no hepatomegaly or splenomegaly.     Tenderness: There is no abdominal tenderness.  Musculoskeletal:        General: Normal range of motion.     Cervical back: Normal range of motion and neck supple.     Right lower leg: No edema.     Left lower leg: No edema.  Lymphadenopathy:     Head:     Right side of head: No submental, submandibular, tonsillar, preauricular or posterior auricular adenopathy.     Left side of head: No submental, submandibular, tonsillar, preauricular or posterior auricular adenopathy.     Cervical: No cervical adenopathy.     Upper Body:     Right upper body: No supraclavicular, axillary or pectoral adenopathy.     Left upper body: No supraclavicular, axillary or pectoral adenopathy.  Skin:    General: Skin is warm and dry.     Capillary Refill: Capillary refill takes less than 2 seconds.     Findings: No rash.  Neurological:     Mental Status: She is alert and oriented to person, place, and time.     Gait: Gait is intact.  Psychiatric:        Attention and Perception: Attention normal.        Mood and Affect: Mood normal.  Speech: Speech normal.        Behavior: Behavior normal. Behavior is cooperative.        Thought Content: Thought content normal.        Judgment: Judgment normal.     Results for orders placed or performed during the hospital encounter of 10/21/23  [facility] Molecular Screen - Blood ([facility])   Collection Time: 10/21/23  8:02 AM  Result Value Ref Range   Genetic Analysis Overall Interpretation Negative    Genetic Disease Assessed      This is a screening test and does not detect all pathogenic or likely pathogenic variant(s) in the tested genes; diagnostic testing is recommended for individuals with a personal or family history of heart disease or hereditary cancer. [employer] [facility]   Population Screen is a screening test that analyzes 11 genes related to hereditary breast and ovarian cancer (HBOC) syndrome, Lynch syndrome, and familial hypercholesterolemia. This test only reports clinically significant pathogenic and likely  pathogenic variants but does not report variants of uncertain significance (VUS). In addition, analysis of the PMS2 gene excludes exons 11-15, which overlap with a known pseudogene (PMS2CL).    Genetic Analysis Report      No pathogenic or likely pathogenic variants were detected in the genes analyzed by this test.Genetic test results should be interpreted in the context of an individual's personal medical and family history. Alteration to medical management is NOT  recommended based solely on this result. Clinical correlation is advised.Additional Considerations- This is a screening test; individuals may still carry pathogenic or likely pathogenic variant(s) in the tested genes that are not detected by this test.-  For individuals at risk for these or other related conditions based on factors including personal or family history, diagnostic testing is recommended.- The absence of pathogenic or likely pathogenic variant(s) in the analyzed genes, while reassuring,  does not eliminate the possibility of a hereditary condition; there are other variants and genes associated with heart disease and hereditary cancer that are not included in this test.    Genes Tested See Notes    Disclaimer See Notes    Sequencing Location See Notes    Interpretation Methods and Limitations See Notes       Assessment & Plan:   Problem List Items Addressed This Visit       Other   Encounter for smoking cessation counseling   Discussed cessation and available treatment including nicotine replacement options, pharmacologic treatment, and/or online resources. Based on our discussion, she does plan to initiate treatment today. Plans to initiate tx w/ Varenicline  (Chantix ). Total  time spent on discussion: 5 minutes.        Hypercholesterolemia   Chronic.  Controlled.  Continue with current medication regimen.  Labs ordered today.  Return to clinic in 6 months for reevaluation.  Call sooner if concerns arise.        Relevant Orders   Lipid panel   Other Visit Diagnoses       Annual physical exam    -  Primary   Health maintenance reviewed during visit today. Labs ordered . Vaccines reviewed. Mammogram will be done next month. PAP up to date. Cologuard ordered.   Relevant Orders   CBC with Differential/Platelet   Comprehensive metabolic panel with GFR   Lipid panel   TSH   CBC     Flu vaccine need       Relevant Orders   Flu vaccine trivalent PF, 6mos and older(Flulaval,Afluria,Fluarix,Fluzone) (Completed)  Need for prophylactic vaccination with Streptococcus pneumoniae (Pneumococcus) and Influenza vaccines         Screening for colon cancer       Relevant Orders   Cologuard     Elevated blood pressure reading       Elevated at visit today. This is unusual for patient. WIll follow up in 3 months.  Can discuss medication at that time if still elevated.         Follow up plan: Return in about 3 months (around 06/02/2024) for BP Check and smoking cessation.   LABORATORY TESTING:  - Pap smear: pap done  IMMUNIZATIONS:   - Tdap: Tetanus vaccination status reviewed: last tetanus booster within 10 years. - Influenza: Refused - Pneumovax: Not applicable - Prevnar: not applicable - COVID: Refused - HPV: Not applicable - Shingrix vaccine: Refused  SCREENING: -Mammogram: Up to date  - Colonoscopy: Up to date  - Bone Density: Not applicable  -Hearing Test: Not applicable  -Spirometry: Not applicable   PATIENT COUNSELING:   Advised to take 1 mg of folate supplement per day if capable of pregnancy.   Sexuality: Discussed sexually transmitted diseases, partner selection, use of condoms, avoidance of unintended pregnancy  and contraceptive  alternatives.   Advised to avoid cigarette smoking.  I discussed with the patient that most people either abstain from alcohol or drink within safe limits (<=14/week and <=4 drinks/occasion for males, <=7/weeks and <= 3 drinks/occasion for females) and that the risk for alcohol disorders and other health effects rises proportionally with the number of drinks per week and how often a drinker exceeds daily limits.  Discussed cessation/primary prevention of drug use and availability of treatment for abuse.   Diet: Encouraged to adjust caloric intake to maintain  or achieve ideal body weight, to reduce intake of dietary saturated fat and total fat, to limit sodium intake by avoiding high sodium foods and not adding table salt, and to maintain adequate dietary potassium and calcium preferably from fresh fruits, vegetables, and low-fat dairy products.    stressed the importance of regular exercise  Injury prevention: Discussed safety belts, safety helmets, smoke detector, smoking near bedding or upholstery.   Dental health: Discussed importance of regular tooth brushing, flossing, and dental visits.    NEXT PREVENTATIVE PHYSICAL DUE IN 1 YEAR. Return in about 3 months (around 06/02/2024) for BP Check and smoking cessation.          "

## 2024-03-04 NOTE — Assessment & Plan Note (Signed)
 Discussed cessation and available treatment including nicotine replacement options, pharmacologic treatment, and/or online resources. Based on our discussion, she does plan to initiate treatment today. Plans to initiate tx w/ Varenicline  (Chantix ). Total time spent on discussion: 5 minutes.

## 2024-03-04 NOTE — Assessment & Plan Note (Signed)
 Chronic.  Controlled.  Continue with current medication regimen.  Labs ordered today.  Return to clinic in 6 months for reevaluation.  Call sooner if concerns arise.  ? ?

## 2024-03-05 ENCOUNTER — Ambulatory Visit: Payer: Self-pay | Admitting: Nurse Practitioner

## 2024-03-05 DIAGNOSIS — F329 Major depressive disorder, single episode, unspecified: Secondary | ICD-10-CM

## 2024-03-05 LAB — COMPREHENSIVE METABOLIC PANEL WITH GFR
ALT: 21 IU/L (ref 0–32)
AST: 20 IU/L (ref 0–40)
Albumin: 4.6 g/dL (ref 3.8–4.9)
Alkaline Phosphatase: 121 IU/L (ref 49–135)
BUN/Creatinine Ratio: 13 (ref 9–23)
BUN: 16 mg/dL (ref 6–24)
Bilirubin Total: 0.3 mg/dL (ref 0.0–1.2)
CO2: 25 mmol/L (ref 20–29)
Calcium: 9.5 mg/dL (ref 8.7–10.2)
Chloride: 101 mmol/L (ref 96–106)
Creatinine, Ser: 1.19 mg/dL — ABNORMAL HIGH (ref 0.57–1.00)
Globulin, Total: 2.5 g/dL (ref 1.5–4.5)
Glucose: 85 mg/dL (ref 70–99)
Potassium: 4.5 mmol/L (ref 3.5–5.2)
Sodium: 141 mmol/L (ref 134–144)
Total Protein: 7.1 g/dL (ref 6.0–8.5)
eGFR: 54 mL/min/1.73 — ABNORMAL LOW

## 2024-03-05 LAB — TSH: TSH: 2.66 u[IU]/mL (ref 0.450–4.500)

## 2024-03-05 LAB — LIPID PANEL
Chol/HDL Ratio: 2.8 ratio (ref 0.0–4.4)
Cholesterol, Total: 177 mg/dL (ref 100–199)
HDL: 64 mg/dL
LDL Chol Calc (NIH): 78 mg/dL (ref 0–99)
Triglycerides: 214 mg/dL — ABNORMAL HIGH (ref 0–149)
VLDL Cholesterol Cal: 35 mg/dL (ref 5–40)

## 2024-03-07 ENCOUNTER — Encounter: Payer: Self-pay | Admitting: Nurse Practitioner

## 2024-03-08 ENCOUNTER — Other Ambulatory Visit

## 2024-03-08 DIAGNOSIS — F329 Major depressive disorder, single episode, unspecified: Secondary | ICD-10-CM

## 2024-03-08 NOTE — Telephone Encounter (Signed)
 Appt has been rescheduled

## 2024-03-09 ENCOUNTER — Ambulatory Visit: Payer: Self-pay | Admitting: Nurse Practitioner

## 2024-03-09 LAB — COMPREHENSIVE METABOLIC PANEL WITH GFR
ALT: 15 IU/L (ref 0–32)
AST: 19 IU/L (ref 0–40)
Albumin: 4.3 g/dL (ref 3.8–4.9)
Alkaline Phosphatase: 108 IU/L (ref 49–135)
BUN/Creatinine Ratio: 20 (ref 9–23)
BUN: 16 mg/dL (ref 6–24)
Bilirubin Total: 0.5 mg/dL (ref 0.0–1.2)
CO2: 19 mmol/L — ABNORMAL LOW (ref 20–29)
Calcium: 9.1 mg/dL (ref 8.7–10.2)
Chloride: 100 mmol/L (ref 96–106)
Creatinine, Ser: 0.8 mg/dL (ref 0.57–1.00)
Globulin, Total: 2.3 g/dL (ref 1.5–4.5)
Glucose: 84 mg/dL (ref 70–99)
Potassium: 4.1 mmol/L (ref 3.5–5.2)
Sodium: 135 mmol/L (ref 134–144)
Total Protein: 6.6 g/dL (ref 6.0–8.5)
eGFR: 88 mL/min/1.73

## 2024-06-02 ENCOUNTER — Ambulatory Visit: Admitting: Nurse Practitioner
